# Patient Record
Sex: Male | Born: 1955 | Race: White | Hispanic: No | Marital: Married | Smoking: Never smoker
Health system: Southern US, Community
[De-identification: ages and names within clinical notes are randomized; demographics above are authoritative.]

## PROBLEM LIST (undated history)

## (undated) DIAGNOSIS — M109 Gout, unspecified: Secondary | ICD-10-CM

## (undated) DIAGNOSIS — I469 Cardiac arrest, cause unspecified: Secondary | ICD-10-CM

## (undated) DIAGNOSIS — E785 Hyperlipidemia, unspecified: Secondary | ICD-10-CM

## (undated) DIAGNOSIS — R7401 Elevation of levels of liver transaminase levels: Secondary | ICD-10-CM

## (undated) DIAGNOSIS — I4891 Unspecified atrial fibrillation: Secondary | ICD-10-CM

## (undated) DIAGNOSIS — I10 Essential (primary) hypertension: Secondary | ICD-10-CM

## (undated) DIAGNOSIS — R740 Nonspecific elevation of levels of transaminase and lactic acid dehydrogenase [LDH]: Secondary | ICD-10-CM

## (undated) DIAGNOSIS — I2510 Atherosclerotic heart disease of native coronary artery without angina pectoris: Secondary | ICD-10-CM

## (undated) DIAGNOSIS — I255 Ischemic cardiomyopathy: Secondary | ICD-10-CM

## (undated) HISTORY — DX: Gout, unspecified: M10.9

---

## 2008-07-22 LAB — HM COLONOSCOPY: HM Colonoscopy: NORMAL

## 2010-07-13 DIAGNOSIS — M109 Gout, unspecified: Secondary | ICD-10-CM

## 2010-07-13 HISTORY — DX: Gout, unspecified: M10.9

## 2011-07-14 HISTORY — PX: CORONARY ANGIOPLASTY WITH STENT PLACEMENT: SHX49

## 2011-09-07 ENCOUNTER — Ambulatory Visit (INDEPENDENT_AMBULATORY_CARE_PROVIDER_SITE_OTHER): Payer: BC Managed Care – PPO | Admitting: Family Medicine

## 2011-09-07 VITALS — BP 127/82 | HR 76 | Temp 99.0°F | Resp 16 | Ht 71.0 in | Wt 194.0 lb

## 2011-09-07 DIAGNOSIS — J029 Acute pharyngitis, unspecified: Secondary | ICD-10-CM

## 2011-09-07 LAB — POCT RAPID STREP A (OFFICE): Rapid Strep A Screen: NEGATIVE

## 2011-09-07 MED ORDER — AMOXICILLIN 875 MG PO TABS: 875.0000 mg | ORAL_TABLET | Freq: Two times a day (BID) | ORAL | Status: AC

## 2011-09-07 NOTE — Progress Notes (Signed)
  Subjective:    Patient ID: Gary Andrews, male    DOB: 1955/11/14, 56 y.o.   MRN: [id number]  HPI 56 yo male with URI symptoms.  Pain with swallowing, pain in lymph nodes, achy, subjective fever.  No runny nose, cough, ear problems. No history of strep throat.  "Get sick once a year".  Only hurts on left side of throat. Going on 3 days.  Ibuprofen - helping a little.    Review of Systems Negative except as per HPI     Objective:   Physical Exam  Constitutional: He appears well-developed. No distress.  HENT:  Right Ear: Tympanic membrane, external ear and ear canal normal. Tympanic membrane is not injected, not scarred, not perforated, not erythematous, not retracted and not bulging.  Left Ear: Tympanic membrane, external ear and ear canal normal. Tympanic membrane is not injected, not scarred, not perforated, not erythematous, not retracted and not bulging.  Nose: No mucosal edema or rhinorrhea. Right sinus exhibits no maxillary sinus tenderness and no frontal sinus tenderness. Left sinus exhibits no maxillary sinus tenderness and no frontal sinus tenderness.  Mouth/Throat: Uvula is midline and mucous membranes are normal. Posterior oropharyngeal erythema present. No oropharyngeal exudate or tonsillar abscesses.  Cardiovascular: Normal rate, regular rhythm, normal heart sounds and intact distal pulses.   No murmur heard. Pulmonary/Chest: Effort normal and breath sounds normal. No respiratory distress. He has no wheezes. He has no rales.  Lymphadenopathy:       Head (right side): No submandibular and no preauricular adenopathy present.       Head (left side): No submandibular and no preauricular adenopathy present.       Right cervical: No superficial cervical and no posterior cervical adenopathy present.      Left cervical: No superficial cervical and no posterior cervical adenopathy present.       Right: No supraclavicular adenopathy present.       Left: No supraclavicular adenopathy  present.  Skin: Skin is warm and dry.   Left tonsil enlarge, with exudate.  Left submandibular lymphadenopathy.    Results for orders placed in visit on 09/07/11  POCT RAPID STREP A (OFFICE)      Component Value Range   Rapid Strep A Screen Negative  Negative         Assessment & Plan:  Sore throat - tonsillitis, nonstrep amox Ibuprofen fluids

## 2011-09-11 ENCOUNTER — Ambulatory Visit (INDEPENDENT_AMBULATORY_CARE_PROVIDER_SITE_OTHER): Payer: BC Managed Care – PPO | Admitting: Family Medicine

## 2011-09-11 VITALS — BP 123/75 | HR 66 | Temp 98.3°F | Resp 16 | Ht 71.25 in | Wt 189.8 lb

## 2011-09-11 DIAGNOSIS — J029 Acute pharyngitis, unspecified: Secondary | ICD-10-CM

## 2011-09-11 DIAGNOSIS — J312 Chronic pharyngitis: Secondary | ICD-10-CM

## 2011-09-11 MED ORDER — CEFDINIR 300 MG PO CAPS: 300.0000 mg | ORAL_CAPSULE | Freq: Two times a day (BID) | ORAL | Status: AC

## 2011-09-11 NOTE — Progress Notes (Signed)
  Patient Name: Gary Andrews Date of Birth: 04-Feb-1956 Medical Record Number: [id number] Gender: male Date of Encounter: 09/11/2011  History of Present Illness:  Gary Andrews is a 56 y.o. very pleasant male patient who presents with the following:  Was here on 2/25 for a sore throat- he had a negative strep and was started on amoxicillin but he has not gotten much better.  Fever is gone, but does have a mild left ear ache.   Rare cough, no GI symptoms.  He is concerned mainly because of a persistent ST.  No other URI or conjunctivitis symptoms  There is no problem list on file for this patient.  No past medical history on file. No past surgical history on file. History  Substance Use Topics  . Smoking status: Never Smoker   . Smokeless tobacco: Not on file  . Alcohol Use: Not on file   No family history on file. No Known Allergies  Medication list has been reviewed and updated.  Review of Systems: As per HPI- otherwise negative.   Physical Examination: Filed Vitals:   09/11/11 1022  BP: 123/75  Pulse: 66  Temp: 98.3 F (36.8 C)  TempSrc: Oral  Resp: 16  Height: 5' 11.25" (1.81 m)  Weight: 189 lb 12.8 oz (86.093 kg)    Body mass index is 26.29 kg/(m^2).  GEN: WDWN, NAD, Non-toxic, A & O x 3 HEENT: Atraumatic, Normocephalic. Neck supple. No masses, No LAD.  Left side of oropharynx has exudate and a small amount of swelling. No evidence of abscess.  TM wnl bilaterally Ears and Nose: No external deformity. CV: RRR, No M/G/R. No JVD. No thrill. No extra heart sounds. PULM: CTA B, no wheezes, crackles, rhonchi. No retractions. No resp. distress. No accessory muscle use. ABD: S, NT, ND, +BS. No rebound. No HSM. EXTR: No c/c/e NEURO Normal gait.  PSYCH: Normally interactive. Conversant. Not depressed or anxious appearing.  Calm demeanor.    Assessment and Plan: 1. Pharyngitis  cefdinir (OMNICEF) 300 MG capsule, Culture, Group A Strep   Possible resistant strep infection  will change to omnicef and await tcx.  However, counseled patient that he may well have a virus and that is why he is not better yet. Patient (or parent if minor) instructed to return to clinic or call if not better in 3 day(s). Sooner if worse.

## 2011-09-13 LAB — CULTURE, GROUP A STREP: Organism ID, Bacteria: NORMAL

## 2012-05-14 ENCOUNTER — Encounter (HOSPITAL_COMMUNITY)
Admission: EM | Disposition: A | Discharge status: Home / Self Care | Payer: Self-pay | Source: Home / Self Care | Attending: Cardiovascular Disease

## 2012-05-14 ENCOUNTER — Ambulatory Visit (HOSPITAL_COMMUNITY): Admit: 2012-05-14 | Payer: Self-pay | Admitting: Cardiovascular Disease

## 2012-05-14 ENCOUNTER — Inpatient Hospital Stay (HOSPITAL_COMMUNITY)
Admission: EM | Admit: 2012-05-14 | Discharge: 2012-05-17 | DRG: 550 | Disposition: A | Discharge status: Home / Self Care | Payer: BC Managed Care – PPO | Attending: Cardiovascular Disease | Admitting: Cardiovascular Disease

## 2012-05-14 ENCOUNTER — Encounter (HOSPITAL_COMMUNITY): Payer: Self-pay | Admitting: Neurology

## 2012-05-14 ENCOUNTER — Emergency Department (HOSPITAL_COMMUNITY): Payer: BC Managed Care – PPO

## 2012-05-14 DIAGNOSIS — I2510 Atherosclerotic heart disease of native coronary artery without angina pectoris: Secondary | ICD-10-CM | POA: Diagnosis present

## 2012-05-14 DIAGNOSIS — I249 Acute ischemic heart disease, unspecified: Secondary | ICD-10-CM

## 2012-05-14 DIAGNOSIS — T07XXXA Unspecified multiple injuries, initial encounter: Secondary | ICD-10-CM

## 2012-05-14 DIAGNOSIS — I2109 ST elevation (STEMI) myocardial infarction involving other coronary artery of anterior wall: Principal | ICD-10-CM | POA: Diagnosis present

## 2012-05-14 DIAGNOSIS — I219 Acute myocardial infarction, unspecified: Secondary | ICD-10-CM

## 2012-05-14 DIAGNOSIS — Y998 Other external cause status: Secondary | ICD-10-CM

## 2012-05-14 DIAGNOSIS — R55 Syncope and collapse: Secondary | ICD-10-CM | POA: Diagnosis present

## 2012-05-14 DIAGNOSIS — I213 ST elevation (STEMI) myocardial infarction of unspecified site: Secondary | ICD-10-CM

## 2012-05-14 DIAGNOSIS — I469 Cardiac arrest, cause unspecified: Secondary | ICD-10-CM | POA: Diagnosis present

## 2012-05-14 DIAGNOSIS — Z9861 Coronary angioplasty status: Secondary | ICD-10-CM

## 2012-05-14 DIAGNOSIS — Z955 Presence of coronary angioplasty implant and graft: Secondary | ICD-10-CM

## 2012-05-14 DIAGNOSIS — I4891 Unspecified atrial fibrillation: Secondary | ICD-10-CM | POA: Diagnosis present

## 2012-05-14 DIAGNOSIS — I2589 Other forms of chronic ischemic heart disease: Secondary | ICD-10-CM | POA: Diagnosis present

## 2012-05-14 DIAGNOSIS — Z79899 Other long term (current) drug therapy: Secondary | ICD-10-CM

## 2012-05-14 DIAGNOSIS — R7401 Elevation of levels of liver transaminase levels: Secondary | ICD-10-CM

## 2012-05-14 DIAGNOSIS — R112 Nausea with vomiting, unspecified: Secondary | ICD-10-CM | POA: Diagnosis present

## 2012-05-14 DIAGNOSIS — Y9355 Activity, bike riding: Secondary | ICD-10-CM

## 2012-05-14 DIAGNOSIS — S7000XA Contusion of unspecified hip, initial encounter: Secondary | ICD-10-CM | POA: Diagnosis present

## 2012-05-14 DIAGNOSIS — I5021 Acute systolic (congestive) heart failure: Secondary | ICD-10-CM | POA: Diagnosis present

## 2012-05-14 DIAGNOSIS — Z23 Encounter for immunization: Secondary | ICD-10-CM

## 2012-05-14 DIAGNOSIS — K7200 Acute and subacute hepatic failure without coma: Secondary | ICD-10-CM | POA: Diagnosis present

## 2012-05-14 DIAGNOSIS — I2584 Coronary atherosclerosis due to calcified coronary lesion: Secondary | ICD-10-CM | POA: Diagnosis present

## 2012-05-14 HISTORY — DX: Essential (primary) hypertension: I10

## 2012-05-14 HISTORY — DX: Unspecified atrial fibrillation: I48.91

## 2012-05-14 HISTORY — DX: Elevation of levels of liver transaminase levels: R74.01

## 2012-05-14 HISTORY — DX: Hyperlipidemia, unspecified: E78.5

## 2012-05-14 HISTORY — PX: LEFT HEART CATHETERIZATION WITH CORONARY ANGIOGRAM: SHX5451

## 2012-05-14 HISTORY — DX: Cardiac arrest, cause unspecified: I46.9

## 2012-05-14 HISTORY — DX: Atherosclerotic heart disease of native coronary artery without angina pectoris: I25.10

## 2012-05-14 HISTORY — DX: Nonspecific elevation of levels of transaminase and lactic acid dehydrogenase (ldh): R74.0

## 2012-05-14 HISTORY — DX: Ischemic cardiomyopathy: I25.5

## 2012-05-14 LAB — CBC WITH DIFFERENTIAL/PLATELET
Basophils Absolute: 0 10*3/uL (ref 0.0–0.1)
Basophils Relative: 0 % (ref 0–1)
Eosinophils Absolute: 0 10*3/uL (ref 0.0–0.7)
Eosinophils Relative: 0 % (ref 0–5)
HCT: 43.2 % (ref 39.0–52.0)
Hemoglobin: 14.8 g/dL (ref 13.0–17.0)
Lymphocytes Relative: 16 % (ref 12–46)
Lymphs Abs: 2.2 10*3/uL (ref 0.7–4.0)
MCH: 31.5 pg (ref 26.0–34.0)
MCHC: 34.3 g/dL (ref 30.0–36.0)
MCV: 91.9 fL (ref 78.0–100.0)
Monocytes Absolute: 0.9 10*3/uL (ref 0.1–1.0)
Monocytes Relative: 6 % (ref 3–12)
Neutro Abs: 10.9 10*3/uL — ABNORMAL HIGH (ref 1.7–7.7)
Neutrophils Relative %: 77 % (ref 43–77)
Platelets: 200 10*3/uL (ref 150–400)
RBC: 4.7 MIL/uL (ref 4.22–5.81)
RDW: 13.1 % (ref 11.5–15.5)
WBC: 14.1 10*3/uL — ABNORMAL HIGH (ref 4.0–10.5)

## 2012-05-14 LAB — COMPREHENSIVE METABOLIC PANEL
ALT: 1034 U/L — ABNORMAL HIGH (ref 0–53)
ALT: 919 U/L — ABNORMAL HIGH (ref 0–53)
AST: 829 U/L — ABNORMAL HIGH (ref 0–37)
AST: 774 U/L — ABNORMAL HIGH (ref 0–37)
Albumin: 4 g/dL (ref 3.5–5.2)
Albumin: 3.8 g/dL (ref 3.5–5.2)
Alkaline Phosphatase: 75 U/L (ref 39–117)
Alkaline Phosphatase: 70 U/L (ref 39–117)
BUN: 17 mg/dL (ref 6–23)
BUN: 17 mg/dL (ref 6–23)
CO2: 20 mEq/L (ref 19–32)
CO2: 20 mEq/L (ref 19–32)
Calcium: 9.3 mg/dL (ref 8.4–10.5)
Calcium: 9 mg/dL (ref 8.4–10.5)
Chloride: 103 mEq/L (ref 96–112)
Chloride: 108 mEq/L (ref 96–112)
Creatinine, Ser: 0.99 mg/dL (ref 0.50–1.35)
Creatinine, Ser: 0.9 mg/dL (ref 0.50–1.35)
GFR calc Af Amer: >90 mL/min (ref >90)
GFR calc Af Amer: >90 mL/min (ref >90)
GFR calc non Af Amer: >90 mL/min (ref >90)
GFR calc non Af Amer: >90 mL/min (ref >90)
Glucose, Bld: 164 mg/dL — ABNORMAL HIGH (ref 70–99)
Glucose, Bld: 105 mg/dL — ABNORMAL HIGH (ref 70–99)
Potassium: 3.6 mEq/L (ref 3.5–5.1)
Potassium: 4.1 mEq/L (ref 3.5–5.1)
Sodium: 142 mEq/L (ref 135–145)
Sodium: 143 mEq/L (ref 135–145)
Total Bilirubin: 0.6 mg/dL (ref 0.3–1.2)
Total Bilirubin: 0.5 mg/dL (ref 0.3–1.2)
Total Protein: 6.7 g/dL (ref 6.0–8.3)
Total Protein: 6.2 g/dL (ref 6.0–8.3)

## 2012-05-14 LAB — POCT I-STAT TROPONIN I: Troponin i, poc: 0.04 ng/mL (ref 0.00–0.08)

## 2012-05-14 LAB — CBC
HCT: 43.4 % (ref 39.0–52.0)
Hemoglobin: 15.1 g/dL (ref 13.0–17.0)
MCH: 32.1 pg (ref 26.0–34.0)
MCHC: 34.8 g/dL (ref 30.0–36.0)
MCV: 92.3 fL (ref 78.0–100.0)
Platelets: 146 10*3/uL — ABNORMAL LOW (ref 150–400)
RBC: 4.7 MIL/uL (ref 4.22–5.81)
RDW: 13.2 % (ref 11.5–15.5)
WBC: 10.6 10*3/uL — ABNORMAL HIGH (ref 4.0–10.5)

## 2012-05-14 LAB — MRSA PCR SCREENING: MRSA by PCR: NEGATIVE

## 2012-05-14 LAB — TROPONIN I
Troponin I: 0.44 ng/mL (ref <0.30)
Troponin I: 2.6 ng/mL (ref <0.30)

## 2012-05-14 LAB — APTT: aPTT: 77 seconds — ABNORMAL HIGH (ref 24–37)

## 2012-05-14 LAB — MAGNESIUM: Magnesium: 2 mg/dL (ref 1.5–2.5)

## 2012-05-14 LAB — PROTIME-INR
INR: 1.16 (ref 0.00–1.49)
Prothrombin Time: 14.6 seconds (ref 11.6–15.2)

## 2012-05-14 LAB — LIPASE, BLOOD: Lipase: 33 U/L (ref 11–59)

## 2012-05-14 SURGERY — LEFT HEART CATHETERIZATION WITH CORONARY ANGIOGRAM
Anesthesia: LOCAL

## 2012-05-14 MED ORDER — PRASUGREL HCL 10 MG PO TABS
10.0000 mg | ORAL_TABLET | Freq: Every day | ORAL | Status: DC
  Administered 2012-05-15 – 2012-05-17 (×3): 10 mg via ORAL
  Filled 2012-05-14 (×3): qty 1

## 2012-05-14 MED ORDER — SODIUM CHLORIDE 0.9 % IV SOLN: 250.0000 mL | INTRAVENOUS | Status: DC | PRN

## 2012-05-14 MED ORDER — ONDANSETRON HCL 4 MG/2ML IJ SOLN
4.0000 mg | Freq: Once | INTRAMUSCULAR | Status: AC
  Administered 2012-05-14: 4 mg via INTRAVENOUS

## 2012-05-14 MED ORDER — HEPARIN (PORCINE) IN NACL 2-0.9 UNIT/ML-% IJ SOLN
INTRAMUSCULAR | Status: AC
  Filled 2012-05-14: qty 1000

## 2012-05-14 MED ORDER — ASPIRIN 81 MG PO CHEW
81.0000 mg | CHEWABLE_TABLET | Freq: Every day | ORAL | Status: DC
Start: 2012-05-14 — End: 2012-05-16
  Administered 2012-05-15: 81 mg via ORAL

## 2012-05-14 MED ORDER — AMIODARONE HCL IN DEXTROSE 360-4.14 MG/200ML-% IV SOLN
0.5000 mg/min | INTRAVENOUS | Status: DC
  Administered 2012-05-14: 0.5 mg/min via INTRAVENOUS
  Filled 2012-05-14 (×3): qty 200

## 2012-05-14 MED ORDER — MIDAZOLAM HCL 2 MG/2ML IJ SOLN
INTRAMUSCULAR | Status: AC
  Filled 2012-05-14: qty 2

## 2012-05-14 MED ORDER — FENTANYL CITRATE 0.05 MG/ML IJ SOLN
INTRAMUSCULAR | Status: AC
  Filled 2012-05-14: qty 2

## 2012-05-14 MED ORDER — ACETAMINOPHEN 325 MG PO TABS: 650.0000 mg | ORAL_TABLET | ORAL | Status: DC | PRN

## 2012-05-14 MED ORDER — AMIODARONE HCL 150 MG/3ML IV SOLN
INTRAVENOUS | Status: AC
  Filled 2012-05-14: qty 3

## 2012-05-14 MED ORDER — ONDANSETRON HCL 4 MG/2ML IJ SOLN
INTRAMUSCULAR | Status: AC
  Filled 2012-05-14: qty 2

## 2012-05-14 MED ORDER — AMIODARONE HCL IN DEXTROSE 360-4.14 MG/200ML-% IV SOLN
0.5000 mg/min | INTRAVENOUS | Status: DC
  Filled 2012-05-14 (×2): qty 200

## 2012-05-14 MED ORDER — AMIODARONE HCL IN DEXTROSE 360-4.14 MG/200ML-% IV SOLN
1.0000 mg/min | INTRAVENOUS | Status: AC
  Administered 2012-05-14: 1 mg/min via INTRAVENOUS
  Filled 2012-05-14 (×2): qty 200

## 2012-05-14 MED ORDER — LIDOCAINE HCL (PF) 1 % IJ SOLN
INTRAMUSCULAR | Status: AC
Start: 2012-05-14 — End: 2012-05-14
  Filled 2012-05-14: qty 30

## 2012-05-14 MED ORDER — MORPHINE SULFATE 2 MG/ML IJ SOLN: 2.0000 mg | INTRAMUSCULAR | Status: DC | PRN

## 2012-05-14 MED ORDER — ASPIRIN 81 MG PO CHEW
CHEWABLE_TABLET | ORAL | Status: AC
  Filled 2012-05-14: qty 4

## 2012-05-14 MED ORDER — NITROGLYCERIN 0.2 MG/ML ON CALL CATH LAB
INTRAVENOUS | Status: AC
  Filled 2012-05-14: qty 1

## 2012-05-14 MED ORDER — SODIUM CHLORIDE 0.9 % IJ SOLN
3.0000 mL | Freq: Two times a day (BID) | INTRAMUSCULAR | Status: DC
  Administered 2012-05-15 – 2012-05-17 (×4): 3 mL via INTRAVENOUS

## 2012-05-14 MED ORDER — SODIUM CHLORIDE 0.9 % IV BOLUS (SEPSIS)
1000.0000 mL | Freq: Once | INTRAVENOUS | Status: AC
  Administered 2012-05-14: 1000 mL via INTRAVENOUS

## 2012-05-14 MED ORDER — EPTIFIBATIDE 75 MG/100ML IV SOLN
INTRAVENOUS | Status: AC
  Filled 2012-05-14: qty 100

## 2012-05-14 MED ORDER — PROMETHAZINE HCL 25 MG/ML IJ SOLN
12.5000 mg | Freq: Four times a day (QID) | INTRAMUSCULAR | Status: DC | PRN
  Filled 2012-05-14: qty 1

## 2012-05-14 MED ORDER — CARVEDILOL 3.125 MG PO TABS
3.1250 mg | ORAL_TABLET | Freq: Two times a day (BID) | ORAL | Status: DC
  Administered 2012-05-15 – 2012-05-17 (×5): 3.125 mg via ORAL
  Filled 2012-05-14 (×8): qty 1

## 2012-05-14 MED ORDER — ZOLPIDEM TARTRATE 5 MG PO TABS: 5.0000 mg | ORAL_TABLET | Freq: Every evening | ORAL | Status: DC | PRN

## 2012-05-14 MED ORDER — AMIODARONE HCL IN DEXTROSE 360-4.14 MG/200ML-% IV SOLN: 60.0000 mg/h | INTRAVENOUS | Status: DC

## 2012-05-14 MED ORDER — ATORVASTATIN CALCIUM 80 MG PO TABS
80.0000 mg | ORAL_TABLET | Freq: Every day | ORAL | Status: DC
  Administered 2012-05-14 – 2012-05-16 (×3): 80 mg via ORAL
  Filled 2012-05-14 (×4): qty 1

## 2012-05-14 MED ORDER — SODIUM CHLORIDE 0.9 % IJ SOLN: 3.0000 mL | INTRAMUSCULAR | Status: DC | PRN

## 2012-05-14 MED ORDER — ASPIRIN EC 81 MG PO TBEC
81.0000 mg | DELAYED_RELEASE_TABLET | Freq: Every day | ORAL | Status: DC
  Administered 2012-05-15 – 2012-05-17 (×3): 81 mg via ORAL
  Filled 2012-05-14 (×3): qty 1

## 2012-05-14 MED ORDER — ALPRAZOLAM 0.25 MG PO TABS
0.2500 mg | ORAL_TABLET | Freq: Two times a day (BID) | ORAL | Status: DC | PRN
  Administered 2012-05-14: 0.25 mg via ORAL
  Filled 2012-05-14: qty 1

## 2012-05-14 MED ORDER — ONDANSETRON HCL 4 MG/2ML IJ SOLN
4.0000 mg | Freq: Four times a day (QID) | INTRAMUSCULAR | Status: DC | PRN
  Administered 2012-05-14: 4 mg via INTRAVENOUS
  Filled 2012-05-14: qty 2

## 2012-05-14 MED ORDER — SODIUM CHLORIDE 0.9 % IV SOLN: 250.0000 mL | INTRAVENOUS | Status: DC

## 2012-05-14 MED ORDER — NITROGLYCERIN 0.4 MG SL SUBL: 0.4000 mg | SUBLINGUAL_TABLET | SUBLINGUAL | Status: DC | PRN

## 2012-05-14 MED ORDER — SODIUM CHLORIDE 0.9 % IJ SOLN
3.0000 mL | Freq: Two times a day (BID) | INTRAMUSCULAR | Status: DC
  Administered 2012-05-14: 3 mL via INTRAVENOUS

## 2012-05-14 MED ORDER — AMIODARONE LOAD VIA INFUSION
150.0000 mg | Freq: Once | INTRAVENOUS | Status: DC
  Filled 2012-05-14: qty 83.34

## 2012-05-14 MED ORDER — OXYCODONE-ACETAMINOPHEN 5-325 MG PO TABS
1.0000 | ORAL_TABLET | ORAL | Status: DC | PRN
  Administered 2012-05-14 – 2012-05-16 (×4): 2 via ORAL
  Filled 2012-05-14 (×4): qty 2

## 2012-05-14 MED ORDER — ASPIRIN 81 MG PO CHEW
324.0000 mg | CHEWABLE_TABLET | Freq: Once | ORAL | Status: AC
  Administered 2012-05-14: 324 mg via ORAL

## 2012-05-14 MED ORDER — AMIODARONE HCL IN DEXTROSE 360-4.14 MG/200ML-% IV SOLN
1.0000 mg/min | INTRAVENOUS | Status: DC
  Filled 2012-05-14 (×2): qty 200

## 2012-05-14 MED ORDER — SODIUM CHLORIDE 0.9 % IV SOLN: 1.0000 mL/kg/h | INTRAVENOUS | Status: AC

## 2012-05-14 MED ORDER — PROMETHAZINE HCL 25 MG/ML IJ SOLN: 25.0000 mg | Freq: Four times a day (QID) | INTRAMUSCULAR | Status: DC | PRN

## 2012-05-14 MED ORDER — PRASUGREL HCL 10 MG PO TABS
ORAL_TABLET | ORAL | Status: AC
  Administered 2012-05-15: 10 mg via ORAL
  Filled 2012-05-14: qty 6

## 2012-05-14 MED ORDER — HEPARIN SODIUM (PORCINE) 5000 UNIT/ML IJ SOLN
INTRAMUSCULAR | Status: AC
  Administered 2012-05-14: 4000 [IU]
  Filled 2012-05-14: qty 1

## 2012-05-14 MED ORDER — ONDANSETRON HCL 4 MG/2ML IJ SOLN: 4.0000 mg | Freq: Four times a day (QID) | INTRAMUSCULAR | Status: DC | PRN
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
## 2012-05-14 NOTE — ED Notes (Addendum)
Pt showing increasing ST elevation. Zoll pads in place, crash cart at bedside. Waiting for cath lab

## 2012-05-14 NOTE — Progress Notes (Signed)
Bleeding noted from right groin at 1625. Level 2 groin. Manual pressure immediately held just above perclose site. Christain Sacramento, NP made aware and responded to bedside at 1650. Manual pressure released at 1650 and occlusive pressure dsg applied per order until femstop available. Minimal superficial oozing noted when femstop applied at 1700. Will continue to monitor right groin closely.
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
## 2012-05-14 NOTE — ED Notes (Signed)
All belongings given to patient girlfriend

## 2012-05-14 NOTE — ED Notes (Signed)
Pt taken to cath lab

## 2012-05-14 NOTE — ED Notes (Signed)
Pt had episode of nausea 

## 2012-05-14 NOTE — Progress Notes (Signed)
05/14/12 1300  Clinical Encounter Type  Visited With Family  Visit Type ED  I was paged for a patient code stemi. I spoke briefly to a family member. She did not need any support at this time.  Veryl Speak
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
## 2012-05-14 NOTE — ED Notes (Signed)
Pt c/o cp, "can't breathe". EKG obtained. Showing ST elevation.
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
## 2012-05-14 NOTE — Progress Notes (Signed)
  Amiodarone Drug - Drug Interaction Consult Note  Recommendations: Not currently prescribed any interacting medications  Amiodarone is metabolized by the cytochrome P450 system and therefore has the potential to cause many drug interactions. Amiodarone has an average plasma half-life of 50 days (range 20 to 100 days).   There is potential for drug interactions to occur several weeks or months after stopping treatment and the onset of drug interactions may be slow after initiating amiodarone.   []  Statins: Increased risk of myopathy. Simvastatin- restrict dose to 20mg  daily. Other statins: counsel patients to report any muscle pain or weakness immediately.  []  Anticoagulants: Amiodarone can increase anticoagulant effect. Consider warfarin dose reduction. Patients should be monitored closely and the dose of anticoagulant altered accordingly, remembering that amiodarone levels take several weeks to stabilize.  []  Antiepileptics: Amiodarone can increase plasma concentration of phenytoin, phenytoin dose should be reduced. Note that small changes in phenytoin dose can result in large changes in phenytoin levels. Monitor patient closely and counsel on signs of toxicity.  []  Beta blockers: increased risk of bradycardia, AV block and myocardial depression. Sotalol - avoid concomitant use.  []   Calcium channel blockers (diltiazem and verapamil): increased risk of bradycardia, AV block and myocardial depression.  []   Cyclosporine: Amiodarone increases levels of cyclosporine. Reduced dose of cyclosporine is recommended.  []  Digoxin dose should be halved when amiodarone is started.  []  Diuretics: increased risk of cardiotoxicity if hypokalemia occurs.  []  Oral hypoglycemic agents (glyburide, glipizide, glimepiride): increased risk of hypoglycemia. Patient's glucose levels should be monitored closely when initiating amiodarone therapy.   []  Drugs that prolong the QT interval: Concurrent therapy is  contraindicated due to the increased risk of torsades de pointes; . Antibiotics: e.g. fluoroquinolones, erythromycin. . Antiarrhythmics: e.g. quinidine, procainamide, disopyramide, sotalol. . Antipsychotics: e.g. phenothiazines, haloperidol.  . Lithium, tricyclic antidepressants, and methadone. Thank You,  Marcelino Scot  05/14/2012 4:41 PM

## 2012-05-14 NOTE — ED Notes (Signed)
Waiting for cath lab to call, ST elevation on monitor. NRB in place.

## 2012-05-15 DIAGNOSIS — I200 Unstable angina: Secondary | ICD-10-CM

## 2012-05-15 DIAGNOSIS — I213 ST elevation (STEMI) myocardial infarction of unspecified site: Secondary | ICD-10-CM

## 2012-05-15 DIAGNOSIS — I369 Nonrheumatic tricuspid valve disorder, unspecified: Secondary | ICD-10-CM

## 2012-05-15 DIAGNOSIS — I249 Acute ischemic heart disease, unspecified: Secondary | ICD-10-CM

## 2012-05-15 DIAGNOSIS — R7401 Elevation of levels of liver transaminase levels: Secondary | ICD-10-CM

## 2012-05-15 LAB — CBC
HCT: 37.9 % — ABNORMAL LOW (ref 39.0–52.0)
Hemoglobin: 12.9 g/dL — ABNORMAL LOW (ref 13.0–17.0)
MCH: 31.4 pg (ref 26.0–34.0)
MCHC: 34 g/dL (ref 30.0–36.0)
MCV: 92.2 fL (ref 78.0–100.0)
Platelets: 141 10*3/uL — ABNORMAL LOW (ref 150–400)
RBC: 4.11 MIL/uL — ABNORMAL LOW (ref 4.22–5.81)
RDW: 13.3 % (ref 11.5–15.5)
WBC: 5.7 10*3/uL (ref 4.0–10.5)

## 2012-05-15 LAB — BASIC METABOLIC PANEL
BUN: 12 mg/dL (ref 6–23)
CO2: 29 mEq/L (ref 19–32)
Calcium: 8.7 mg/dL (ref 8.4–10.5)
Chloride: 105 mEq/L (ref 96–112)
Creatinine, Ser: 0.86 mg/dL (ref 0.50–1.35)
GFR calc Af Amer: >90 mL/min (ref >90)
GFR calc non Af Amer: >90 mL/min (ref >90)
Glucose, Bld: 98 mg/dL (ref 70–99)
Potassium: 4 mEq/L (ref 3.5–5.1)
Sodium: 141 mEq/L (ref 135–145)

## 2012-05-15 LAB — TROPONIN I: Troponin I: 1.84 ng/mL (ref <0.30)

## 2012-05-15 LAB — LIPID PANEL
Cholesterol: 170 mg/dL (ref 0–200)
HDL: 47 mg/dL (ref >39)
LDL Cholesterol: 113 mg/dL — ABNORMAL HIGH (ref 0–99)
Total CHOL/HDL Ratio: 3.6 RATIO
Triglycerides: 48 mg/dL (ref <150)
VLDL: 10 mg/dL (ref 0–40)

## 2012-05-15 LAB — TSH: TSH: 1.296 u[IU]/mL (ref 0.350–4.500)

## 2012-05-15 LAB — HEMOGLOBIN A1C
Hgb A1c MFr Bld: 5.5 % (ref <5.7)
Mean Plasma Glucose: 111 mg/dL (ref <117)

## 2012-05-15 MED ORDER — INFLUENZA VIRUS VACC SPLIT PF IM SUSP
0.5000 mL | INTRAMUSCULAR | Status: AC
  Administered 2012-05-16: 0.5 mL via INTRAMUSCULAR
  Filled 2012-05-15: qty 0.5

## 2012-05-15 NOTE — Progress Notes (Signed)
  Echocardiogram 2D Echocardiogram has been performed.  Gary Andrews 05/15/2012, 12:18 PM
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
## 2012-05-15 NOTE — Progress Notes (Signed)
Patient examined and I agree with the assessment and plan  Violeta Gelinas, MD, MPH, FACS Pager: [phone]  05/15/2012 12:59 PM
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
## 2012-05-16 LAB — COMPREHENSIVE METABOLIC PANEL
ALT: 511 U/L — ABNORMAL HIGH (ref 0–53)
AST: 237 U/L — ABNORMAL HIGH (ref 0–37)
Albumin: 3.5 g/dL (ref 3.5–5.2)
Alkaline Phosphatase: 61 U/L (ref 39–117)
BUN: 13 mg/dL (ref 6–23)
CO2: 30 mEq/L (ref 19–32)
Calcium: 9 mg/dL (ref 8.4–10.5)
Chloride: 106 mEq/L (ref 96–112)
Creatinine, Ser: 0.92 mg/dL (ref 0.50–1.35)
GFR calc Af Amer: >90 mL/min (ref >90)
GFR calc non Af Amer: >90 mL/min (ref >90)
Glucose, Bld: 92 mg/dL (ref 70–99)
Potassium: 4 mEq/L (ref 3.5–5.1)
Sodium: 143 mEq/L (ref 135–145)
Total Bilirubin: 0.6 mg/dL (ref 0.3–1.2)
Total Protein: 6.1 g/dL (ref 6.0–8.3)

## 2012-05-16 LAB — CBC
HCT: 38.7 % — ABNORMAL LOW (ref 39.0–52.0)
Hemoglobin: 13 g/dL (ref 13.0–17.0)
MCH: 31.6 pg (ref 26.0–34.0)
MCHC: 33.6 g/dL (ref 30.0–36.0)
MCV: 93.9 fL (ref 78.0–100.0)
Platelets: 144 10*3/uL — ABNORMAL LOW (ref 150–400)
RBC: 4.12 MIL/uL — ABNORMAL LOW (ref 4.22–5.81)
RDW: 13.3 % (ref 11.5–15.5)
WBC: 4.6 10*3/uL (ref 4.0–10.5)

## 2012-05-16 LAB — POCT ACTIVATED CLOTTING TIME
Activated Clotting Time: 190 seconds
Activated Clotting Time: 214 seconds
Activated Clotting Time: 214 seconds

## 2012-05-16 MED ORDER — LISINOPRIL 2.5 MG PO TABS
2.5000 mg | ORAL_TABLET | Freq: Every day | ORAL | Status: DC
  Administered 2012-05-16 – 2012-05-17 (×2): 2.5 mg via ORAL
  Filled 2012-05-16 (×2): qty 1
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
## 2012-05-16 NOTE — Progress Notes (Signed)
UR Completed Purvis Sidle Graves-Bigelow, RN,BSN [phone]  
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
## 2012-05-16 NOTE — Care Management Note (Unsigned)
    Page 1 of 1   05/16/2012     11:17:54 AM   CARE MANAGEMENT NOTE 05/16/2012  Patient:  Gary Andrews, Gary Andrews   Account Number:  [id number]  Date Initiated:  05/16/2012  Documentation initiated by:  GRAVES-BIGELOW,Jaiyden Laur  Subjective/Objective Assessment:   Pt admitted with Stemi. Post cath. Plan for home tomorrow. CM did a benefits check for effient. Will make pt aware once completed.     Action/Plan:   CM will continue to f/u for additional needs.   Anticipated DC Date:  05/17/2012   Anticipated DC Plan:  HOME/SELF CARE      DC Planning Services  CM consult      Choice offered to / List presented to:             Status of service:  In process, will continue to follow Medicare Important Message given?   (If response is "NO", the following Medicare IM given date fields will be blank) Date Medicare IM given:   Date Additional Medicare IM given:    Discharge Disposition:    Per UR Regulation:  Reviewed for med. necessity/level of care/duration of stay  If discussed at Long Length of Stay Meetings, dates discussed:    Comments:

## 2012-05-17 ENCOUNTER — Encounter (HOSPITAL_COMMUNITY): Payer: Self-pay | Admitting: Physician Assistant

## 2012-05-17 ENCOUNTER — Telehealth: Payer: Self-pay | Admitting: Pharmacist

## 2012-05-17 DIAGNOSIS — I2510 Atherosclerotic heart disease of native coronary artery without angina pectoris: Secondary | ICD-10-CM

## 2012-05-17 MED ORDER — ASPIRIN 81 MG PO TABS: 81.0000 mg | ORAL_TABLET | Freq: Every day | ORAL | Status: AC

## 2012-05-17 MED ORDER — CARVEDILOL 3.125 MG PO TABS: 3.1250 mg | ORAL_TABLET | Freq: Two times a day (BID) | ORAL | Status: DC

## 2012-05-17 MED ORDER — ATORVASTATIN CALCIUM 80 MG PO TABS: 80.0000 mg | ORAL_TABLET | Freq: Every day | ORAL | Status: DC

## 2012-05-17 MED ORDER — LISINOPRIL 2.5 MG PO TABS: 2.5000 mg | ORAL_TABLET | Freq: Every day | ORAL | Status: DC

## 2012-05-17 MED ORDER — PRASUGREL HCL 10 MG PO TABS: 10.0000 mg | ORAL_TABLET | Freq: Every day | ORAL | Status: DC

## 2012-05-17 MED ORDER — NITROGLYCERIN 0.4 MG SL SUBL: 0.4000 mg | SUBLINGUAL_TABLET | SUBLINGUAL | Status: DC | PRN
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
## 2012-05-17 NOTE — Telephone Encounter (Signed)
Message copied by Velda Shell on Tue May 17, 2012 12:01 PM ------      Message from: Glynda Jaeger A      Created: Tue May 17, 2012 11:52 AM       Transition of care pt per dayna                  Thanks Melissa

## 2012-05-18 ENCOUNTER — Other Ambulatory Visit: Payer: Self-pay | Admitting: *Deleted

## 2012-05-18 DIAGNOSIS — R7401 Elevation of levels of liver transaminase levels: Secondary | ICD-10-CM

## 2012-05-18 DIAGNOSIS — I249 Acute ischemic heart disease, unspecified: Secondary | ICD-10-CM

## 2012-05-18 DIAGNOSIS — I213 ST elevation (STEMI) myocardial infarction of unspecified site: Secondary | ICD-10-CM

## 2012-05-18 NOTE — Telephone Encounter (Signed)
Left message on VM for pt to call office

## 2012-05-18 NOTE — Telephone Encounter (Signed)
**Note De-Identified Ineta Sinning Obfuscation** Pt. states that he is doing well since his d/c from hosp. and that he has all of his medications. Also, pt. Is aware of f/u appt. with Norma Fredrickson, NP on 11/12.

## 2012-05-24 ENCOUNTER — Encounter: Payer: Self-pay | Admitting: Nurse Practitioner

## 2012-05-24 ENCOUNTER — Ambulatory Visit (INDEPENDENT_AMBULATORY_CARE_PROVIDER_SITE_OTHER): Payer: BC Managed Care – PPO | Admitting: Nurse Practitioner

## 2012-05-24 ENCOUNTER — Other Ambulatory Visit (INDEPENDENT_AMBULATORY_CARE_PROVIDER_SITE_OTHER): Payer: BC Managed Care – PPO

## 2012-05-24 VITALS — BP 110/76 | HR 58 | Ht 72.0 in | Wt 183.8 lb

## 2012-05-24 DIAGNOSIS — I25810 Atherosclerosis of coronary artery bypass graft(s) without angina pectoris: Secondary | ICD-10-CM

## 2012-05-24 DIAGNOSIS — I213 ST elevation (STEMI) myocardial infarction of unspecified site: Secondary | ICD-10-CM

## 2012-05-24 DIAGNOSIS — R0989 Other specified symptoms and signs involving the circulatory and respiratory systems: Secondary | ICD-10-CM

## 2012-05-24 DIAGNOSIS — R7401 Elevation of levels of liver transaminase levels: Secondary | ICD-10-CM

## 2012-05-24 DIAGNOSIS — I249 Acute ischemic heart disease, unspecified: Secondary | ICD-10-CM

## 2012-05-24 DIAGNOSIS — E785 Hyperlipidemia, unspecified: Secondary | ICD-10-CM
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
## 2012-05-24 NOTE — Patient Instructions (Addendum)
Stay on your current medicines  Use Coricidin HBP for cold symptoms  Call us if you have fever/chills  Tylenol is ok to take  We will check labs today  See Dr. Elease Hashimoto before your trip. We will do fasting labs at that visit  Walking 10 minutes twice a day. Avoid biking at this time.   Not to return to work until seen back at your next visit  We will refer you to primary care  Call the [facility] office at [phone] if you have any questions, problems or concerns.

## 2012-05-25 LAB — HEPATIC FUNCTION PANEL
ALT: 92 U/L — ABNORMAL HIGH (ref 0–53)
AST: 42 U/L — ABNORMAL HIGH (ref 0–37)
Albumin: 4.5 g/dL (ref 3.5–5.2)
Alkaline Phosphatase: 84 U/L (ref 39–117)
Bilirubin, Direct: 0.1 mg/dL (ref 0.0–0.3)
Total Bilirubin: 0.8 mg/dL (ref 0.3–1.2)
Total Protein: 7.5 g/dL (ref 6.0–8.3)

## 2012-05-25 LAB — COMPREHENSIVE METABOLIC PANEL
ALT: 92 U/L — ABNORMAL HIGH (ref 0–53)
AST: 42 U/L — ABNORMAL HIGH (ref 0–37)
Albumin: 4.5 g/dL (ref 3.5–5.2)
Alkaline Phosphatase: 84 U/L (ref 39–117)
BUN: 15 mg/dL (ref 6–23)
CO2: 25 mEq/L (ref 19–32)
Calcium: 9.2 mg/dL (ref 8.4–10.5)
Chloride: 102 mEq/L (ref 96–112)
Creatinine, Ser: 0.9 mg/dL (ref 0.4–1.5)
GFR: 96.94 mL/min (ref >60.00)
Glucose, Bld: 82 mg/dL (ref 70–99)
Potassium: 4.4 mEq/L (ref 3.5–5.1)
Sodium: 138 mEq/L (ref 135–145)
Total Bilirubin: 0.8 mg/dL (ref 0.3–1.2)
Total Protein: 7.5 g/dL (ref 6.0–8.3)

## 2012-05-25 LAB — LIPID PANEL
Cholesterol: 142 mg/dL (ref 0–200)
HDL: 34.4 mg/dL — ABNORMAL LOW (ref >39.00)
LDL Cholesterol: 79 mg/dL (ref 0–99)
Total CHOL/HDL Ratio: 4
Triglycerides: 144 mg/dL (ref 0.0–149.0)
VLDL: 28.8 mg/dL (ref 0.0–40.0)

## 2012-06-02 ENCOUNTER — Ambulatory Visit (INDEPENDENT_AMBULATORY_CARE_PROVIDER_SITE_OTHER): Payer: BC Managed Care – PPO | Admitting: Cardiovascular Disease

## 2012-06-02 ENCOUNTER — Other Ambulatory Visit: Payer: Self-pay | Admitting: *Deleted

## 2012-06-02 ENCOUNTER — Other Ambulatory Visit (INDEPENDENT_AMBULATORY_CARE_PROVIDER_SITE_OTHER): Payer: BC Managed Care – PPO

## 2012-06-02 ENCOUNTER — Encounter: Payer: Self-pay | Admitting: Cardiovascular Disease

## 2012-06-02 VITALS — BP 100/76 | HR 62 | Ht 72.0 in | Wt 187.2 lb

## 2012-06-02 DIAGNOSIS — E785 Hyperlipidemia, unspecified: Secondary | ICD-10-CM

## 2012-06-02 DIAGNOSIS — I213 ST elevation (STEMI) myocardial infarction of unspecified site: Secondary | ICD-10-CM

## 2012-06-02 DIAGNOSIS — I249 Acute ischemic heart disease, unspecified: Secondary | ICD-10-CM

## 2012-06-02 DIAGNOSIS — I2510 Atherosclerotic heart disease of native coronary artery without angina pectoris: Secondary | ICD-10-CM

## 2012-06-02 DIAGNOSIS — I219 Acute myocardial infarction, unspecified: Secondary | ICD-10-CM

## 2012-06-02 DIAGNOSIS — I200 Unstable angina: Secondary | ICD-10-CM

## 2012-06-02 LAB — LIPID PANEL
Cholesterol: 136 mg/dL (ref 0–200)
HDL: 37.4 mg/dL — ABNORMAL LOW (ref >39.00)
LDL Cholesterol: 76 mg/dL (ref 0–99)
Total CHOL/HDL Ratio: 4
Triglycerides: 114 mg/dL (ref 0.0–149.0)
VLDL: 22.8 mg/dL (ref 0.0–40.0)

## 2012-06-02 LAB — BASIC METABOLIC PANEL
BUN: 18 mg/dL (ref 6–23)
CO2: 31 mEq/L (ref 19–32)
Calcium: 9.4 mg/dL (ref 8.4–10.5)
Chloride: 105 mEq/L (ref 96–112)
Creatinine, Ser: 1 mg/dL (ref 0.4–1.5)
GFR: 85.5 mL/min (ref >60.00)
Glucose, Bld: 101 mg/dL — ABNORMAL HIGH (ref 70–99)
Potassium: 3.9 mEq/L (ref 3.5–5.1)
Sodium: 141 mEq/L (ref 135–145)

## 2012-06-02 LAB — HEPATIC FUNCTION PANEL
ALT: 55 U/L — ABNORMAL HIGH (ref 0–53)
AST: 42 U/L — ABNORMAL HIGH (ref 0–37)
Albumin: 4.2 g/dL (ref 3.5–5.2)
Alkaline Phosphatase: 89 U/L (ref 39–117)
Bilirubin, Direct: 0 mg/dL (ref 0.0–0.3)
Total Bilirubin: 0.9 mg/dL (ref 0.3–1.2)
Total Protein: 7.2 g/dL (ref 6.0–8.3)
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
## 2012-06-02 NOTE — Patient Instructions (Addendum)
Follow up in one week after the Echo in the [place] office. Need to have an Echo in 6 weeks. Continue with the current medications.

## 2012-06-02 NOTE — Progress Notes (Signed)
Gary Andrews Date of Birth  Feb 24, 1956       [facility]    [place] [address]  [address] [place], [place]  [address]   [place], [place]  [address] [phone]     [phone]   Fax  [phone]    Fax [phone]  Problem List: 1. Coronary artery disease: -Status post myocardial infarction while slicing. He had a ventricular fibrillation cardiac arrest received CPR. He had a stent in his LAD .  Hx of LCx 2. Hyperlipidemia  History of Present Illness:  Gary Andrews is a 56 yo with hx of CAD  He has an old stent ( ? 2 stents) to the LCx.   He had been having angina while riding for two weeks prior to his recent MI.  He presented on November 3 with a cardiac arrest while cycling up a hill.  He had stopped taking his statin.   He was found to have an occluded LAD. He had PTCA and stenting of his left anterior descending artery. The stent to the left circumflex artery was still patent.  He was seen by Gary Andrews last week. He's overall feeling quite well. He's not had any episodes of chest pain or shortness of breath.  Current Outpatient Prescriptions on File Prior to Visit  Medication Sig Dispense Refill  . aspirin 81 MG tablet Take 1 tablet (81 mg total) by mouth daily.      Marland Kitchen atorvastatin (LIPITOR) 80 MG tablet Take 1 tablet (80 mg total) by mouth at bedtime.  30 tablet  6  . carvedilol (COREG) 3.125 MG tablet Take 1 tablet (3.125 mg total) by mouth 2 (two) times daily with a meal.  60 tablet  6  . lisinopril (PRINIVIL,ZESTRIL) 2.5 MG tablet Take 1 tablet (2.5 mg total) by mouth daily.  30 tablet  6  . minocycline (MINOCIN,DYNACIN) 100 MG capsule Take 100 mg by mouth daily.      . Multiple Vitamin (MULTIVITAMIN) capsule Take 1 capsule by mouth daily.      . nitroGLYCERIN (NITROSTAT) 0.4 MG SL tablet Place 1 tablet (0.4 mg total) under the tongue every 5 (five) minutes as needed for chest pain (up to 3 doses).  25 tablet  4  . prasugrel (EFFIENT)  10 MG TABS Take 1 tablet (10 mg total) by mouth daily.  30 tablet  6    No Known Allergies  Past Medical History  Diagnosis Date  . HTN (hypertension)   . Hyperlipidemia   . Atrial fibrillation     a. Noted at time of STEMI 05/2012, converted on amiodarone.  . Cardiac arrest     a. 05/2012 - came in as bystander CPR, pulses back before EMS arrived at time of STEMI.  . Ischemic cardiomyopathy     a. EF 35% by cath at time of STEMI, improved to 50-55% at time of DC 05/2012.  . Transaminitis     a. At time of STEMI 05/2012 ?shock liver.  Marland Kitchen CAD (coronary artery disease)     a. 2012 s/p stenting x 2 (3 mos apart) [facility] [place]. b. Acute ant-lat STEMI s/p DES to prox LAD 05/14/12, residual mod Cx dz for med rx - possible cardiac arrest (CPR).    Past Surgical History  Procedure Date  . Coronary angioplasty with stent placement 2013    History  Smoking status  . Never Smoker   Smokeless tobacco  . Not on file  History  Alcohol Use  . Yes    Comment: occasional drink.    Family History  Problem Relation Age of Onset  . CAD Father     alive  . Heart disease Father   . Heart attack Father   . CAD Brother     alive  . Heart disease Brother   . Heart disease Brother     Reviw of Systems:  Reviewed in the HPI.  All other systems are negative.  Physical Exam: Blood pressure 100/76, pulse 62, height 6' (1.829 m), weight 187 lb 4 oz (84.936 kg). General: Well developed, well nourished, in no acute distress.  Head: Normocephalic, atraumatic, sclera non-icteric, mucus membranes are moist,   Neck: Supple. Carotids are 2 + without bruits. No JVD  Lungs: Clear bilaterally to auscultation.  Heart: regular rate.  normal  S1 S2. No murmurs, gallops or rubs.  Abdomen: Soft, non-tender, non-distended with normal bowel sounds. No hepatomegaly. No rebound/guarding. No masses.  Msk:  Strength and tone are normal  Extremities: No clubbing or cyanosis. No edema.  Distal pedal  pulses are 2+ and equal bilaterally.  Neuro: Alert and oriented X 3. Moves all extremities spontaneously.  Psych:  Responds to questions appropriately with a normal affect.  ECG: 06/02/2012-normal sinus rhythm. He has an incomplete right bundle branch block.  Assessment / Plan:

## 2012-07-19 ENCOUNTER — Ambulatory Visit (INDEPENDENT_AMBULATORY_CARE_PROVIDER_SITE_OTHER): Payer: BC Managed Care – PPO | Admitting: Cardiology

## 2012-07-19 ENCOUNTER — Ambulatory Visit (INDEPENDENT_AMBULATORY_CARE_PROVIDER_SITE_OTHER): Payer: BC Managed Care – PPO

## 2012-07-19 ENCOUNTER — Telehealth: Payer: Self-pay

## 2012-07-19 DIAGNOSIS — I2510 Atherosclerotic heart disease of native coronary artery without angina pectoris: Secondary | ICD-10-CM

## 2012-07-19 DIAGNOSIS — I213 ST elevation (STEMI) myocardial infarction of unspecified site: Secondary | ICD-10-CM

## 2012-07-19 DIAGNOSIS — M2550 Pain in unspecified joint: Secondary | ICD-10-CM

## 2012-07-19 NOTE — Telephone Encounter (Signed)
Uric acid level is still pending.  He can have colchicine 0.6 mg BID to see if that helps. Also he can try Motrin 400 TID for 3-4 days to see if that helps

## 2012-07-19 NOTE — Telephone Encounter (Signed)
FYI Pt is new to [place] No PCP Establishing with PCP 1/10 for the first time Here for echo Seeing you 1/10 in [place] Says he thinks he has "gout" and needs meds for this We advised him to f/u with PCP but he explained he does not have one yet (new appt 1/10) We went ahead and drew uric acid levels while in office today and can forward to PCP if you feel necessary Pt wanted [place] to go ahead and prescribe a pain med for his "gout" We explained this cannot be done until levels come back, if at all He will await results

## 2012-07-20 ENCOUNTER — Other Ambulatory Visit: Payer: Self-pay

## 2012-07-20 LAB — URIC ACID: Uric Acid: 6 mg/dL (ref 3.7–8.6)

## 2012-07-20 MED ORDER — COLCHICINE 0.6 MG PO TABS: 0.6000 mg | ORAL_TABLET | Freq: Two times a day (BID) | ORAL | Status: DC

## 2012-07-20 NOTE — Telephone Encounter (Signed)
lmtcb

## 2012-07-20 NOTE — Telephone Encounter (Signed)
Pt informed Understanding verb New RX sent to pharmacy 

## 2012-07-21 ENCOUNTER — Ambulatory Visit: Payer: BC Managed Care – PPO | Admitting: Internal Medicine

## 2012-07-22 ENCOUNTER — Other Ambulatory Visit: Payer: BC Managed Care – PPO

## 2012-07-22 ENCOUNTER — Encounter: Payer: Self-pay | Admitting: Cardiovascular Disease

## 2012-07-22 ENCOUNTER — Other Ambulatory Visit (INDEPENDENT_AMBULATORY_CARE_PROVIDER_SITE_OTHER): Payer: BC Managed Care – PPO

## 2012-07-22 ENCOUNTER — Ambulatory Visit (INDEPENDENT_AMBULATORY_CARE_PROVIDER_SITE_OTHER): Payer: BC Managed Care – PPO | Admitting: Internal Medicine

## 2012-07-22 ENCOUNTER — Ambulatory Visit (INDEPENDENT_AMBULATORY_CARE_PROVIDER_SITE_OTHER): Payer: BC Managed Care – PPO | Admitting: Cardiovascular Disease

## 2012-07-22 ENCOUNTER — Encounter: Payer: Self-pay | Admitting: Internal Medicine

## 2012-07-22 VITALS — BP 98/68 | HR 52 | Ht 72.0 in | Wt 189.0 lb

## 2012-07-22 VITALS — BP 118/88 | HR 60 | Temp 97.0°F | Resp 16 | Ht 72.0 in | Wt 189.0 lb

## 2012-07-22 DIAGNOSIS — M109 Gout, unspecified: Secondary | ICD-10-CM

## 2012-07-22 DIAGNOSIS — I2510 Atherosclerotic heart disease of native coronary artery without angina pectoris: Secondary | ICD-10-CM

## 2012-07-22 DIAGNOSIS — D649 Anemia, unspecified: Secondary | ICD-10-CM

## 2012-07-22 DIAGNOSIS — R7401 Elevation of levels of liver transaminase levels: Secondary | ICD-10-CM

## 2012-07-22 DIAGNOSIS — Z23 Encounter for immunization: Secondary | ICD-10-CM

## 2012-07-22 DIAGNOSIS — Z0000 Encounter for general adult medical examination without abnormal findings: Secondary | ICD-10-CM

## 2012-07-22 DIAGNOSIS — Z9861 Coronary angioplasty status: Secondary | ICD-10-CM

## 2012-07-22 DIAGNOSIS — R7309 Other abnormal glucose: Secondary | ICD-10-CM

## 2012-07-22 LAB — CBC WITH DIFFERENTIAL/PLATELET
Basophils Absolute: 0 10*3/uL (ref 0.0–0.1)
Basophils Relative: 0.4 % (ref 0.0–3.0)
Eosinophils Absolute: 0.1 10*3/uL (ref 0.0–0.7)
Eosinophils Relative: 1.5 % (ref 0.0–5.0)
HCT: 44.2 % (ref 39.0–52.0)
Hemoglobin: 14.9 g/dL (ref 13.0–17.0)
Lymphocytes Relative: 39 % (ref 12.0–46.0)
Lymphs Abs: 1.6 10*3/uL (ref 0.7–4.0)
MCHC: 33.7 g/dL (ref 30.0–36.0)
MCV: 92.8 fl (ref 78.0–100.0)
Monocytes Absolute: 0.5 10*3/uL (ref 0.1–1.0)
Monocytes Relative: 11.7 % (ref 3.0–12.0)
Neutro Abs: 1.9 10*3/uL (ref 1.4–7.7)
Neutrophils Relative %: 47.4 % (ref 43.0–77.0)
Platelets: 185 10*3/uL (ref 150.0–400.0)
RBC: 4.76 Mil/uL (ref 4.22–5.81)
RDW: 13.9 % (ref 11.5–14.6)
WBC: 4 10*3/uL — ABNORMAL LOW (ref 4.5–10.5)

## 2012-07-22 LAB — URINALYSIS, ROUTINE W REFLEX MICROSCOPIC
Bilirubin Urine: NEGATIVE
Hgb urine dipstick: NEGATIVE
Ketones, ur: NEGATIVE
Leukocytes, UA: NEGATIVE
Nitrite: NEGATIVE
Specific Gravity, Urine: 1.015 (ref 1.000–1.030)
Total Protein, Urine: NEGATIVE
Urine Glucose: NEGATIVE
Urobilinogen, UA: 0.2 (ref 0.0–1.0)
pH: 6 (ref 5.0–8.0)

## 2012-07-22 LAB — PSA: PSA: 0.9 ng/mL (ref 0.10–4.00)

## 2012-07-22 LAB — IBC PANEL
Iron: 92 ug/dL (ref 42–165)
Saturation Ratios: 26 % (ref 20.0–50.0)
Transferrin: 252.9 mg/dL (ref 212.0–360.0)

## 2012-07-22 LAB — COMPREHENSIVE METABOLIC PANEL
ALT: 36 U/L (ref 0–53)
AST: 35 U/L (ref 0–37)
Albumin: 4.5 g/dL (ref 3.5–5.2)
Alkaline Phosphatase: 74 U/L (ref 39–117)
BUN: 20 mg/dL (ref 6–23)
CO2: 32 mEq/L (ref 19–32)
Calcium: 9.7 mg/dL (ref 8.4–10.5)
Chloride: 105 mEq/L (ref 96–112)
Creatinine, Ser: 0.9 mg/dL (ref 0.4–1.5)
GFR: 88.61 mL/min (ref >60.00)
Glucose, Bld: 72 mg/dL (ref 70–99)
Potassium: 5 mEq/L (ref 3.5–5.1)
Sodium: 141 mEq/L (ref 135–145)
Total Bilirubin: 0.9 mg/dL (ref 0.3–1.2)
Total Protein: 7.2 g/dL (ref 6.0–8.3)

## 2012-07-22 LAB — TSH: TSH: 1.73 u[IU]/mL (ref 0.35–5.50)

## 2012-07-22 LAB — FOLATE: Folate: >24.8 ng/mL (ref >5.9)

## 2012-07-22 LAB — HEMOGLOBIN A1C: Hgb A1c MFr Bld: 5.6 % (ref 4.6–6.5)

## 2012-07-22 LAB — VITAMIN B12: Vitamin B-12: 428 pg/mL (ref 211–911)

## 2012-07-22 LAB — HEPATITIS C ANTIBODY: HCV Ab: NEGATIVE

## 2012-07-22 LAB — FERRITIN: Ferritin: 74.4 ng/mL (ref 22.0–322.0)

## 2012-07-22 NOTE — Assessment & Plan Note (Signed)
I will recheck his LFT's and will screen him for viral hepatitis

## 2012-07-22 NOTE — Patient Instructions (Signed)
Health Maintenance, Males A healthy lifestyle and preventative care can promote health and wellness.  Maintain regular health, dental, and eye exams.  Eat a healthy diet. Foods like vegetables, fruits, whole grains, low-fat dairy products, and lean protein foods contain the nutrients you need without too many calories. Decrease your intake of foods high in solid fats, added sugars, and salt. Get information about a proper diet from your caregiver, if necessary.  Regular physical exercise is one of the most important things you can do for your health. Most adults should get at least 150 minutes of moderate-intensity exercise (any activity that increases your heart rate and causes you to sweat) each week. In addition, most adults need muscle-strengthening exercises on 2 or more days a week.   Maintain a healthy weight. The body mass index (BMI) is a screening tool to identify possible weight problems. It provides an estimate of body fat based on height and weight. Your caregiver can help determine your BMI, and can help you achieve or maintain a healthy weight. For adults 20 years and older:  A BMI below 18.5 is considered underweight.  A BMI of 18.5 to 24.9 is normal.  A BMI of 25 to 29.9 is considered overweight.  A BMI of 30 and above is considered obese.  Maintain normal blood lipids and cholesterol by exercising and minimizing your intake of saturated fat. Eat a balanced diet with plenty of fruits and vegetables. Blood tests for lipids and cholesterol should begin at age 20 and be repeated every 5 years. If your lipid or cholesterol levels are high, you are over 50, or you are a high risk for heart disease, you may need your cholesterol levels checked more frequently.Ongoing high lipid and cholesterol levels should be treated with medicines, if diet and exercise are not effective.  If you smoke, find out from your caregiver how to quit. If you do not use tobacco, do not start.  If you  choose to drink alcohol, do not exceed 2 drinks per day. One drink is considered to be 12 ounces (355 mL) of beer, 5 ounces (148 mL) of wine, or 1.5 ounces (44 mL) of liquor.  Avoid use of street drugs. Do not share needles with anyone. Ask for help if you need support or instructions about stopping the use of drugs.  High blood pressure causes heart disease and increases the risk of stroke. Blood pressure should be checked at least every 1 to 2 years. Ongoing high blood pressure should be treated with medicines if weight loss and exercise are not effective.  If you are 45 to 55 years old, ask your caregiver if you should take aspirin to prevent heart disease.  Diabetes screening involves taking a blood sample to check your fasting blood sugar level. This should be done once every 3 years, after age 45, if you are within normal weight and without risk factors for diabetes. Testing should be considered at a younger age or be carried out more frequently if you are overweight and have at least 1 risk factor for diabetes.  Colorectal cancer can be detected and often prevented. Most routine colorectal cancer screening begins at the age of 50 and continues through age 75. However, your caregiver may recommend screening at an earlier age if you have risk factors for colon cancer. On a yearly basis, your caregiver may provide home test kits to check for hidden blood in the stool. Use of a small camera at the end of a tube,   to directly examine the colon (sigmoidoscopy or colonoscopy), can detect the earliest forms of colorectal cancer. Talk to your caregiver about this at age 50, when routine screening begins. Direct examination of the colon should be repeated every 5 to 10 years through age 75, unless early forms of pre-cancerous polyps or small growths are found.  Hepatitis C blood testing is recommended for all people born from 1945 through 1965 and any individual with known risks for hepatitis C.  Healthy  men should no longer receive prostate-specific antigen (PSA) blood tests as part of routine cancer screening. Consult with your caregiver about prostate cancer screening.  Testicular cancer screening is not recommended for adolescents or adult males who have no symptoms. Screening includes self-exam, caregiver exam, and other screening tests. Consult with your caregiver about any symptoms you have or any concerns you have about testicular cancer.  Practice safe sex. Use condoms and avoid high-risk sexual practices to reduce the spread of sexually transmitted infections (STIs).  Use sunscreen with a sun protection factor (SPF) of 30 or greater. Apply sunscreen liberally and repeatedly throughout the day. You should seek shade when your shadow is shorter than you. Protect yourself by wearing long sleeves, pants, a wide-brimmed hat, and sunglasses year round, whenever you are outdoors.  Notify your caregiver of new moles or changes in moles, especially if there is a change in shape or color. Also notify your caregiver if a mole is larger than the size of a pencil eraser.  A one-time screening for abdominal aortic aneurysm (AAA) and surgical repair of large AAAs by sound wave imaging (ultrasonography) is recommended for ages 65 to 75 years who are current or former smokers.  Stay current with your immunizations. Document Released: 12/26/2007 Document Revised: 09/21/2011 Document Reviewed: 11/24/2010 [employer] Patient Information 2013 [employer].  

## 2012-07-22 NOTE — Patient Instructions (Addendum)
Your physician recommends that you schedule a follow-up appointment in: 2-3 MONTHS   Your physician recommends that you return for a FASTING lipid profile: NMR LIPIOMED

## 2012-07-22 NOTE — Assessment & Plan Note (Signed)
I will check his a1c to see if he has developed DM II 

## 2012-07-22 NOTE — Assessment & Plan Note (Signed)
Exam done Vaccines were updated Labs ordered Pt ed material was given 

## 2012-07-22 NOTE — Progress Notes (Signed)
Modesto Charon Date of Birth  November 07, 1955       [facility]    [place] [address]  [address] [place], [place]  [address]   [place], [place]  [address] [phone]     [phone]   Fax  [phone]    Fax [phone]  Problem List: 1. Coronary artery disease: -Status post myocardial infarction while slicing. He had a ventricular fibrillation cardiac arrest received CPR. He had a stent in his LAD .  Hx of LCx 2. Hyperlipidemia 3. Gout  History of Present Illness:  Gary Andrews is a 57 yo with hx of CAD  He has an old stent ( ? 2 stents) to the LCx.   He had been having angina while riding for two weeks prior to his recent MI.  He presented on November 3 with a cardiac arrest while cycling up a hill.  He had stopped taking his statin.   He was found to have an occluded LAD. He had PTCA and stenting of his left anterior descending artery. The stent to the left circumflex artery was still patent.  He was seen by Lawson Fiscal last week. He's overall feeling quite well. He's not had any episodes of chest pain or shortness of breath.  July 22, 2012: Tinsley has done well from a cardiac standpoint. He did have an acute gout attack. We called them and some colchicine and he seems to be doing much better. He has not seen Dr. Yetta Barre who will continue to follow him for his gout.  He's been riding some since I saw him. He rode approximately 25 miles on January 1. He felt well during the ride.  He's not had any episodes of chest pain, shortness breath, syncope or presyncope.  Current Outpatient Prescriptions on File Prior to Visit  Medication Sig Dispense Refill  . aspirin 81 MG tablet Take 1 tablet (81 mg total) by mouth daily.      Marland Kitchen atorvastatin (LIPITOR) 80 MG tablet Take 1 tablet (80 mg total) by mouth at bedtime.  30 tablet  6  . carvedilol (COREG) 3.125 MG tablet Take 1 tablet (3.125 mg total) by mouth 2 (two) times daily with a meal.  60 tablet  6  .  colchicine 0.6 MG tablet Take 1 tablet (0.6 mg total) by mouth 2 (two) times daily.  60 tablet  3  . lisinopril (PRINIVIL,ZESTRIL) 2.5 MG tablet Take 1 tablet (2.5 mg total) by mouth daily.  30 tablet  6  . minocycline (MINOCIN,DYNACIN) 100 MG capsule Take 100 mg by mouth daily.      . Multiple Vitamin (MULTIVITAMIN) capsule Take 1 capsule by mouth daily.      . nitroGLYCERIN (NITROSTAT) 0.4 MG SL tablet Place 1 tablet (0.4 mg total) under the tongue every 5 (five) minutes as needed for chest pain (up to 3 doses).  25 tablet  4  . prasugrel (EFFIENT) 10 MG TABS Take 1 tablet (10 mg total) by mouth daily.  30 tablet  6    No Known Allergies  Past Medical History  Diagnosis Date  . HTN (hypertension)   . Hyperlipidemia   . Atrial fibrillation     a. Noted at time of STEMI 05/2012, converted on amiodarone.  . Cardiac arrest     a. 05/2012 - came in as bystander CPR, pulses back before EMS arrived at time of STEMI.  . Ischemic cardiomyopathy     a. EF 35% by  cath at time of STEMI, improved to 50-55% at time of DC 05/2012.  . Transaminitis     a. At time of STEMI 05/2012 ?shock liver.  Marland Kitchen CAD (coronary artery disease)     a. 2012 s/p stenting x 2 (3 mos apart) [facility]. b. Acute ant-lat STEMI s/p DES to prox LAD 05/14/12, residual mod Cx dz for med rx - possible cardiac arrest (CPR).  . Gout 2012    Past Surgical History  Procedure Date  . Coronary angioplasty with stent placement 2013    History  Smoking status  . Never Smoker   Smokeless tobacco  . Never Used    History  Alcohol Use  . 1.8 oz/week  . 3 Cans of beer per week    Comment: occasional drink.    Family History  Problem Relation Age of Onset  . CAD Father     alive  . Heart disease Father   . Heart attack Father   . Stroke Father   . CAD Brother     alive  . Heart disease Brother   . Heart disease Brother   . Alcohol abuse Mother   . Alcohol abuse Sister   . Cancer Neg Hx   . Diabetes Neg Hx   .  Early death Neg Hx   . Hyperlipidemia Neg Hx   . Hypertension Neg Hx     Reviw of Systems:  Reviewed in the HPI.  All other systems are negative.  Physical Exam: Blood pressure 98/68, pulse 52, height 6' (1.829 m), weight 189 lb (85.73 kg). General: Well developed, well nourished, in no acute distress.  Head: Normocephalic, atraumatic, sclera non-icteric, mucus membranes are moist,   Neck: Supple. Carotids are 2 + without bruits. No JVD  Lungs: Clear bilaterally to auscultation.  Heart: regular rate.  normal  S1 S2. No murmurs, gallops or rubs.  Abdomen: Soft, non-tender, non-distended with normal bowel sounds. No hepatomegaly. No rebound/guarding. No masses.  Msk:  Strength and tone are normal  Extremities: No clubbing or cyanosis. No edema.  Distal pedal pulses are 2+ and equal bilaterally.  Neuro: Alert and oriented X 3. Moves all extremities spontaneously.  Psych:  Responds to questions appropriately with a normal affect.  ECG: 06/02/2012-normal sinus rhythm. He has an incomplete right bundle branch block.  Assessment / Plan:

## 2012-07-22 NOTE — Assessment & Plan Note (Signed)
I will recheck his CBC and will look at his vitamin levels as well 

## 2012-07-22 NOTE — Progress Notes (Signed)
Subjective:    Patient ID: Gary Andrews, male    DOB: October 23, 1955, 57 y.o.   MRN: [id number]  Arthritis Presents for follow-up visit. He complains of pain. He reports no stiffness, joint swelling or joint warmth. The symptoms have been improving. Affected locations include the right foot. His pain is at a severity of 1/10. Pertinent negatives include no diarrhea, dry eyes, dry mouth, dysuria, fatigue, fever, pain at night, pain while resting, rash, Raynaud's syndrome, uveitis or weight loss.      Review of Systems  Constitutional: Negative for fever, chills, weight loss, diaphoresis, activity change, appetite change, fatigue and unexpected weight change.  HENT: Negative.   Eyes: Negative.   Respiratory: Negative for cough, chest tightness, shortness of breath, wheezing and stridor.   Cardiovascular: Negative for chest pain, palpitations and leg swelling.  Gastrointestinal: Positive for blood in stool, anal bleeding and rectal pain. Negative for nausea, vomiting, abdominal pain, diarrhea, constipation and abdominal distention.  Genitourinary: Negative.  Negative for dysuria and difficulty urinating.  Musculoskeletal: Positive for arthralgias and arthritis. Negative for myalgias, back pain, joint swelling, gait problem and stiffness.  Skin: Negative for color change, pallor, rash and wound.  Neurological: Negative for dizziness, tremors, seizures, syncope, facial asymmetry, speech difficulty, weakness, light-headedness, numbness and headaches.  Hematological: Negative for adenopathy. Does not bruise/bleed easily.  Psychiatric/Behavioral: Negative.        Objective:   Physical Exam  Vitals reviewed. Constitutional: He is oriented to person, place, and time. He appears well-developed and well-nourished.  Non-toxic appearance. He does not have a sickly appearance. He does not appear ill. No distress.  HENT:  Head: Normocephalic and atraumatic.  Mouth/Throat: Oropharynx is clear and moist. No  oropharyngeal exudate.  Eyes: Conjunctivae normal are normal. Right eye exhibits no discharge. Left eye exhibits no discharge. No scleral icterus.  Neck: Normal range of motion. Neck supple. No JVD present. No tracheal deviation present. No thyromegaly present.  Cardiovascular: Normal rate, regular rhythm, normal heart sounds and intact distal pulses.  Exam reveals no gallop and no friction rub.   No murmur heard. Pulmonary/Chest: Effort normal and breath sounds normal. No stridor. No respiratory distress. He has no wheezes. He has no rales. He exhibits no tenderness.  Abdominal: Soft. Bowel sounds are normal. He exhibits no distension and no mass. There is no tenderness. There is no rebound and no guarding. Hernia confirmed negative in the right inguinal area and confirmed negative in the left inguinal area.  Genitourinary: Prostate normal, testes normal and penis normal. Rectal exam shows internal hemorrhoid. Rectal exam shows no external hemorrhoid, no fissure, no mass, no tenderness and anal tone normal. Guaiac positive stool. Prostate is not enlarged and not tender. Right testis shows no mass, no swelling and no tenderness. Right testis is descended. Left testis shows no mass, no swelling and no tenderness. Left testis is descended. Circumcised. No penile erythema or penile tenderness. No discharge found.  Musculoskeletal: Normal range of motion. He exhibits no edema.  Lymphadenopathy:    He has no cervical adenopathy.       Right: No inguinal adenopathy present.       Left: No inguinal adenopathy present.  Neurological: He is oriented to person, place, and time.  Skin: Skin is warm and dry. No rash noted. He is not diaphoretic. No erythema. No pallor.  Psychiatric: He has a normal mood and affect. His behavior is normal. Judgment and thought content normal.     Lab Results  Component Value Date  WBC 4.6 05/16/2012   HGB 13.0 05/16/2012   HCT 38.7* 05/16/2012   PLT 144* 05/16/2012    GLUCOSE 101* 06/02/2012   CHOL 136 06/02/2012   TRIG 114.0 06/02/2012   HDL 37.40* 06/02/2012   LDLCALC 76 06/02/2012   ALT 55* 06/02/2012   AST 42* 06/02/2012   NA 141 06/02/2012   K 3.9 06/02/2012   CL 105 06/02/2012   CREATININE 1.0 06/02/2012   BUN 18 06/02/2012   CO2 31 06/02/2012   TSH 1.296 05/14/2012   INR 1.16 05/14/2012   HGBA1C 5.5 05/15/2012       Assessment & Plan:

## 2012-07-22 NOTE — Assessment & Plan Note (Signed)
Cresencio is doing well. He's not had any episodes of chest pain or shortness of breath. He has been able to cycle without any significant problems.  We will draw a [facility] profile today to make sure that we are are being aggressive enough with his atorvastatin. If his particle number still elevated then we will probably switch to Crestor.  I'll see him again several months. We'll consider doing a stress test at that time if there is any doubt as to whether or not he can get back to cycling at his normal pace.

## 2012-07-22 NOTE — Assessment & Plan Note (Signed)
Uric acid = 6 so no need for uloric He is doing well with colchicine

## 2012-07-23 LAB — HEPATITIS B CORE ANTIBODY, TOTAL: Hep B Core Total Ab: NEGATIVE

## 2012-07-23 LAB — HEPATITIS A ANTIBODY, TOTAL: Hep A Total Ab: NEGATIVE

## 2012-07-25 LAB — HEPATITIS B SURFACE ANTIBODY,QUALITATIVE: Hep B S Ab: NONREACTIVE

## 2012-07-26 ENCOUNTER — Telehealth: Payer: Self-pay | Admitting: *Deleted

## 2012-07-26 NOTE — Telephone Encounter (Signed)
Message copied by Antony Odea on Tue Jul 26, 2012  1:26 PM ------      Message from: Vesta Mixer      Created: Sat Jul 23, 2012  9:53 PM      Regarding: FW: Cancellation of Order # [id number]       Was the Lipomed cancelled????.  We will need him to return to have this drawn again if this was not sent.                  ----- Message -----         From: [facility]         Sent: 07/23/2012   1:04 AM           To: Vesta Mixer, MD      Subject: Cancellation of Order # [id number]                         Order number [id number] for the procedure NMR, LIPOPROFILE       [[id number]] has been canceled by [facility]       [[id number]]. This procedure was ordered by Vesta Mixer, MD       [[id number]] on Jul 22, 2012 for the patient Gary Andrews       [[id number]]. The reason for cancellation was "None".

## 2012-07-26 NOTE — Telephone Encounter (Signed)
Took order to lab/ Mellody Dance reviewed and called lipomed science, they will do test per [facility].

## 2012-07-28 LAB — NMR, LIPOPROFILE

## 2012-08-02 ENCOUNTER — Telehealth: Payer: Self-pay | Admitting: Cardiovascular Disease

## 2012-08-02 DIAGNOSIS — E785 Hyperlipidemia, unspecified: Secondary | ICD-10-CM

## 2012-08-02 DIAGNOSIS — I2510 Atherosclerotic heart disease of native coronary artery without angina pectoris: Secondary | ICD-10-CM

## 2012-08-02 MED ORDER — ROSUVASTATIN CALCIUM 20 MG PO TABS: 20.0000 mg | ORAL_TABLET | Freq: Every day | ORAL | Status: DC

## 2012-08-02 NOTE — Telephone Encounter (Signed)
Pt nmr results given, he will switch meds when finishes atorvastatin- just purchased 30 day supply. MAR adjusted, nmr ordered for return ov.

## 2012-08-02 NOTE — Telephone Encounter (Signed)
F/u   Return call back to nurse

## 2012-09-08 ENCOUNTER — Ambulatory Visit (INDEPENDENT_AMBULATORY_CARE_PROVIDER_SITE_OTHER)
Admission: RE | Admit: 2012-09-08 | Discharge: 2012-09-08 | Disposition: A | Discharge status: Home / Self Care | Payer: BC Managed Care – PPO | Source: Ambulatory Visit | Attending: Internal Medicine | Admitting: Internal Medicine

## 2012-09-08 ENCOUNTER — Ambulatory Visit (INDEPENDENT_AMBULATORY_CARE_PROVIDER_SITE_OTHER): Payer: BC Managed Care – PPO | Admitting: Internal Medicine

## 2012-09-08 ENCOUNTER — Telehealth: Payer: Self-pay | Admitting: Internal Medicine

## 2012-09-08 VITALS — BP 118/72 | HR 56 | Temp 97.0°F | Resp 16 | Wt 188.0 lb

## 2012-09-08 DIAGNOSIS — R071 Chest pain on breathing: Secondary | ICD-10-CM

## 2012-09-08 DIAGNOSIS — R0789 Other chest pain: Secondary | ICD-10-CM

## 2012-09-08 MED ORDER — CELECOXIB 200 MG PO CAPS: 200.0000 mg | ORAL_CAPSULE | Freq: Two times a day (BID) | ORAL | Status: DC

## 2012-09-08 MED ORDER — CELECOXIB 200 MG PO CAPS: 200.0000 mg | ORAL_CAPSULE | Freq: Every day | ORAL | Status: DC

## 2012-09-08 NOTE — Telephone Encounter (Signed)
Patient Information:  Caller Name: Leonidus  Phone: [phone]  Patient: Gary Andrews, Gary Andrews  Gender: Male  DOB: 1955/10/17  Age: 57 Years  PCP: Sanda Linger (Adults only)  Office Follow Up:  Does the office need to follow up with this patient?: No  Instructions For The Office: N/A  RN Note:  pt has appt scheduled for 09/16/12 but was not sure if he should wait that long to be seen.  RN advised that patient should be seen today. Pt agreeable.  Symptoms  Reason For Call & Symptoms: caller reports he is having intermittent pains under ribs on right side.  Caller reports he has never experienced pain like this (stabbing and tender to touch).  Pt rates pain as 3/10.  Pt reports he had an MI on 05/14/12 and the pain does not feel the same.  But he is very aware of any pain now in his chest.  Reviewed Health History In EMR: Yes  Reviewed Medications In EMR: Yes  Reviewed Allergies In EMR: Yes  Reviewed Surgeries / Procedures: Yes  Date of Onset of Symptoms: 09/03/2012  Treatments Tried: Tylenol  Treatments Tried Worked: No  Guideline(s) Used:  Chest Pain  Disposition Per Guideline:   See Today in Office  Reason For Disposition Reached:   Intermittent chest pains persist > 3 days  Advice Given:  Call Back If:  You become worse.  Appointment Scheduled:  09/08/2012 16:15:00 Appointment Scheduled Provider:  Sanda Linger (Adults only)

## 2012-09-08 NOTE — Patient Instructions (Signed)
Chest Wall Pain Chest wall pain is pain in or around the bones and muscles of your chest. It may take up to 6 weeks to get better. It may take longer if you must stay physically active in your work and activities.  CAUSES  Chest wall pain may happen on its own. However, it may be caused by:  A viral illness like the flu.  Injury.  Coughing.  Exercise.  Arthritis.  Fibromyalgia.  Shingles. HOME CARE INSTRUCTIONS   Avoid overtiring physical activity. Try not to strain or perform activities that cause pain. This includes any activities using your chest or your abdominal and side muscles, especially if heavy weights are used.  Put ice on the sore area.  Put ice in a plastic bag.  Place a towel between your skin and the bag.  Leave the ice on for 15 to 20 minutes per hour while awake for the first 2 days.  Only take over-the-counter or prescription medicines for pain, discomfort, or fever as directed by your caregiver. SEEK IMMEDIATE MEDICAL CARE IF:   Your pain increases, or you are very uncomfortable.  You have a fever.  Your chest pain becomes worse.  You have new, unexplained symptoms.  You have nausea or vomiting.  You feel sweaty or lightheaded.  You have a cough with phlegm (sputum), or you cough up blood. MAKE SURE YOU:   Understand these instructions.  Will watch your condition.  Will get help right away if you are not doing well or get worse. Document Released: 06/29/2005 Document Revised: 09/21/2011 Document Reviewed: 02/23/2011 [employer] Patient Information 2013 [employer].  

## 2012-09-09 ENCOUNTER — Encounter: Payer: Self-pay | Admitting: Internal Medicine

## 2012-09-11 ENCOUNTER — Encounter: Payer: Self-pay | Admitting: Internal Medicine

## 2012-09-11 NOTE — Progress Notes (Signed)
  Subjective:    Patient ID: Gary Andrews, male    DOB: 21-Oct-1955, 57 y.o.   MRN: [id number]  Chest Pain  This is a new problem. Episode onset: one week. The onset quality is gradual. The problem has been gradually improving. The pain is present in the lateral region (right mid chest wall). The pain is at a severity of 1/10. The pain is mild. Quality: "soreness and cramping" The pain does not radiate. Pertinent negatives include no abdominal pain, back pain, claudication, cough, diaphoresis, dizziness, exertional chest pressure, fever, headaches, hemoptysis, irregular heartbeat, leg pain, lower extremity edema, malaise/fatigue, nausea, near-syncope, numbness, orthopnea, palpitations, PND, shortness of breath, sputum production, syncope, vomiting or weakness. The pain is aggravated by movement and lifting arm (palpation). He has tried acetaminophen for the symptoms. The treatment provided moderate relief.  His past medical history is significant for CAD.      Review of Systems  Constitutional: Negative for fever, chills, malaise/fatigue, diaphoresis, activity change, appetite change, fatigue and unexpected weight change.  HENT: Negative.   Eyes: Negative.   Respiratory: Negative for cough, hemoptysis, sputum production, chest tightness, shortness of breath, wheezing and stridor.   Cardiovascular: Positive for chest pain. Negative for palpitations, orthopnea, claudication, leg swelling, syncope, PND and near-syncope.  Gastrointestinal: Negative.  Negative for nausea, vomiting and abdominal pain.  Endocrine: Negative.   Genitourinary: Negative.   Musculoskeletal: Negative.  Negative for back pain.  Skin: Negative.   Allergic/Immunologic: Negative.   Neurological: Negative.  Negative for dizziness, weakness, light-headedness, numbness and headaches.  Hematological: Negative for adenopathy. Does not bruise/bleed easily.  Psychiatric/Behavioral: Negative.        Objective:   Physical Exam    Vitals reviewed. Constitutional: He is oriented to person, place, and time. He appears well-developed and well-nourished. No distress.  HENT:  Head: Normocephalic and atraumatic.  Mouth/Throat: Oropharynx is clear and moist. No oropharyngeal exudate.  Eyes: Conjunctivae are normal. Right eye exhibits no discharge. Left eye exhibits no discharge. No scleral icterus.  Neck: Normal range of motion. Neck supple. No JVD present. No tracheal deviation present. No thyromegaly present.  Cardiovascular: Normal rate, regular rhythm, normal heart sounds and intact distal pulses.  Exam reveals no gallop and no friction rub.   No murmur heard. Pulmonary/Chest: Effort normal and breath sounds normal. No accessory muscle usage or stridor. Not tachypneic. No respiratory distress. He has no decreased breath sounds. He has no wheezes. He has no rhonchi. He has no rales. Chest wall is not dull to percussion. He exhibits tenderness and bony tenderness. He exhibits no mass, no laceration, no crepitus, no edema, no deformity, no swelling and no retraction.    Abdominal: Soft. Bowel sounds are normal. He exhibits no distension and no mass. There is no tenderness. There is no rebound and no guarding.  Musculoskeletal: Normal range of motion. He exhibits no edema and no tenderness.  Lymphadenopathy:    He has no cervical adenopathy.  Neurological: He is oriented to person, place, and time.  Skin: Skin is warm and dry. No rash noted. He is not diaphoretic. No erythema. No pallor.  Psychiatric: He has a normal mood and affect. His behavior is normal. Judgment and thought content normal.          Assessment & Plan:

## 2012-09-11 NOTE — Assessment & Plan Note (Signed)
The pain does not sound cardiac, it DOES sound musculoskeletal (CXR is normal) I have asked him to try celebrex

## 2012-09-16 ENCOUNTER — Ambulatory Visit: Payer: BC Managed Care – PPO | Admitting: Internal Medicine

## 2012-10-07 ENCOUNTER — Ambulatory Visit (INDEPENDENT_AMBULATORY_CARE_PROVIDER_SITE_OTHER): Payer: BC Managed Care – PPO | Admitting: Cardiovascular Disease

## 2012-10-07 ENCOUNTER — Encounter: Payer: Self-pay | Admitting: Cardiovascular Disease

## 2012-10-07 ENCOUNTER — Other Ambulatory Visit (INDEPENDENT_AMBULATORY_CARE_PROVIDER_SITE_OTHER): Payer: BC Managed Care – PPO

## 2012-10-07 VITALS — BP 106/78 | HR 75 | Ht 72.0 in | Wt 185.0 lb

## 2012-10-07 DIAGNOSIS — E785 Hyperlipidemia, unspecified: Secondary | ICD-10-CM

## 2012-10-07 DIAGNOSIS — I2510 Atherosclerotic heart disease of native coronary artery without angina pectoris: Secondary | ICD-10-CM

## 2012-10-07 DIAGNOSIS — R0989 Other specified symptoms and signs involving the circulatory and respiratory systems: Secondary | ICD-10-CM

## 2012-10-07 DIAGNOSIS — Z9861 Coronary angioplasty status: Secondary | ICD-10-CM

## 2012-10-07 LAB — BASIC METABOLIC PANEL
BUN: 18 mg/dL (ref 6–23)
CO2: 30 mEq/L (ref 19–32)
Calcium: 9.2 mg/dL (ref 8.4–10.5)
Chloride: 98 mEq/L (ref 96–112)
Creatinine, Ser: 1 mg/dL (ref 0.4–1.5)
GFR: 79.67 mL/min (ref >60.00)
Glucose, Bld: 95 mg/dL (ref 70–99)
Potassium: 4.1 mEq/L (ref 3.5–5.1)
Sodium: 138 mEq/L (ref 135–145)

## 2012-10-07 LAB — HEPATIC FUNCTION PANEL
ALT: 36 U/L (ref 0–53)
AST: 32 U/L (ref 0–37)
Albumin: 4.3 g/dL (ref 3.5–5.2)
Alkaline Phosphatase: 61 U/L (ref 39–117)
Bilirubin, Direct: 0.2 mg/dL (ref 0.0–0.3)
Total Bilirubin: 1.1 mg/dL (ref 0.3–1.2)
Total Protein: 7.4 g/dL (ref 6.0–8.3)

## 2012-10-07 NOTE — Progress Notes (Signed)
Gary Andrews Date of Birth  February 10, 1956       [facility]    [place] [address]  [address] [place], [place]  [address]   [place], [place]  [address] [phone]     [phone]   Fax  [phone]    Fax [phone]  Problem List: 1. Coronary artery disease: -Status post myocardial infarction while slicing. He had a ventricular fibrillation cardiac arrest received CPR. He had a stent in his LAD .  Hx of LCx 2. Hyperlipidemia 3. Gout  History of Present Illness:  Gary Andrews is a 57 yo with hx of CAD  He has an old stent ( ? 2 stents) to the LCx.   He had been having angina while riding for two weeks prior to his recent MI.  He presented on November 3 with a cardiac arrest while cycling up a hill.  He had stopped taking his statin.   He was found to have an occluded LAD. He had PTCA and stenting of his left anterior descending artery. The stent to the left circumflex artery was still patent.  He was seen by Lawson Fiscal last week. He's overall feeling quite well. He's not had any episodes of chest pain or shortness of breath.  July 22, 2012: Gary Andrews has done well from a cardiac standpoint. He did have an acute gout attack. We called them and some colchicine and he seems to be doing much better. He has not seen Dr. Yetta Barre who will continue to follow him for his gout.  He's been riding some since I saw him. He rode approximately 25 miles on January 1. He felt well during the ride.  He's not had any episodes of chest pain, shortness breath, syncope or presyncope.  October 07, 2012:  Gary Andrews has done well from a cardiac standpoint.  No angina.  Breathing OK.  He has had some nausea and vomitting.   Current Outpatient Prescriptions on File Prior to Visit  Medication Sig Dispense Refill  . aspirin 81 MG tablet Take 1 tablet (81 mg total) by mouth daily.      . carvedilol (COREG) 3.125 MG tablet Take 1 tablet (3.125 mg total) by mouth 2 (two) times daily with  a meal.  60 tablet  6  . celecoxib (CELEBREX) 200 MG capsule Take 1 capsule (200 mg total) by mouth daily.  15 capsule  0  . colchicine 0.6 MG tablet Take 1 tablet (0.6 mg total) by mouth 2 (two) times daily.  60 tablet  3  . lisinopril (PRINIVIL,ZESTRIL) 2.5 MG tablet Take 1 tablet (2.5 mg total) by mouth daily.  30 tablet  6  . minocycline (MINOCIN,DYNACIN) 100 MG capsule Take 100 mg by mouth daily.      . Multiple Vitamin (MULTIVITAMIN) capsule Take 1 capsule by mouth daily.      . nitroGLYCERIN (NITROSTAT) 0.4 MG SL tablet Place 1 tablet (0.4 mg total) under the tongue every 5 (five) minutes as needed for chest pain (up to 3 doses).  25 tablet  4  . prasugrel (EFFIENT) 10 MG TABS Take 1 tablet (10 mg total) by mouth daily.  30 tablet  6  . rosuvastatin (CRESTOR) 20 MG tablet Take 1 tablet (20 mg total) by mouth daily.  30 tablet  6   No current facility-administered medications on file prior to visit.    No Known Allergies  Past Medical History  Diagnosis Date  . HTN (hypertension)   .  Hyperlipidemia   . Atrial fibrillation     a. Noted at time of STEMI 05/2012, converted on amiodarone.  . Cardiac arrest     a. 05/2012 - came in as bystander CPR, pulses back before EMS arrived at time of STEMI.  . Ischemic cardiomyopathy     a. EF 35% by cath at time of STEMI, improved to 50-55% at time of DC 05/2012.  . Transaminitis     a. At time of STEMI 05/2012 ?shock liver.  Marland Kitchen CAD (coronary artery disease)     a. 2012 s/p stenting x 2 (3 mos apart) [facility]. b. Acute ant-lat STEMI s/p DES to prox LAD 05/14/12, residual mod Cx dz for med rx - possible cardiac arrest (CPR).  . Gout 2012    Past Surgical History  Procedure Laterality Date  . Coronary angioplasty with stent placement  2013    History  Smoking status  . Never Smoker   Smokeless tobacco  . Never Used    History  Alcohol Use  . 1.8 oz/week  . 3 Cans of beer per week    Comment: occasional drink.    Family  History  Problem Relation Age of Onset  . CAD Father     alive  . Heart disease Father   . Heart attack Father   . Stroke Father   . CAD Brother     alive  . Heart disease Brother   . Heart disease Brother   . Alcohol abuse Mother   . Alcohol abuse Sister   . Cancer Neg Hx   . Diabetes Neg Hx   . Early death Neg Hx   . Hyperlipidemia Neg Hx   . Hypertension Neg Hx     Reviw of Systems:  Reviewed in the HPI.  All other systems are negative.  Physical Exam: Blood pressure 106/78, pulse 75, height 6' (1.829 m), weight 185 lb (83.915 kg), SpO2 98.00%. General: Well developed, well nourished, in no acute distress.  Head: Normocephalic, atraumatic, sclera non-icteric, mucus membranes are moist,   Neck: Supple. Carotids are 2 + without bruits. No JVD  Lungs: Clear bilaterally to auscultation.  Heart: regular rate.  normal  S1 S2. No murmurs, gallops or rubs.  Abdomen: Soft, non-tender, non-distended with normal bowel sounds. No hepatomegaly. No rebound/guarding. No masses.  Msk:  Strength and tone are normal  Extremities: No clubbing or cyanosis. No edema.  Distal pedal pulses are 2+ and equal bilaterally.  Neuro: Alert and oriented X 3. Moves all extremities spontaneously.  Psych:  Responds to questions appropriately with a normal affect.  ECG: 06/02/2012-normal sinus rhythm. He has an incomplete right bundle branch block.  Assessment / Plan:

## 2012-10-07 NOTE — Patient Instructions (Addendum)
Your physician recommends that you return for a FASTING lipid profile: today and in 6 months   Your physician wants you to follow-up in: 6 months with ekg You will receive a reminder letter in the mail two months in advance. If you don't receive a letter, please call our office to schedule the follow-up appointment.  Your physician recommends that you continue on your current medications as directed. Please refer to the Current Medication list given to you today.  [facility] Low Glycemic Diet (Source: [facility], 2006) Low Glycemic Foods (20-49) (Decrease risk of developing heart disease) Breakfast Cereals: All-Bran All-Bran Fruit 'n Oats Fiber One Oatmeal (not instant) Oat bran Fruits and fruit juices: (Limit to 1-2 servings per day) Apples Apricots (fresh & dried) Blackberries Blueberries Cherries Cranberries Peaches Pears Plums Prunes Grapefruit Raspberries Strawberries Tangerine Apple juice Grapefruit juice Tomato juice Beans and legumes (fresh-cooked): Black-eyed peas Butter beans Chick peas Lentils  Green beans Lima beans Kidney beans Navy beans Pinto beans Snow peas Non-starchy vegetables: Asparagus, avocado, broccoli, cabbage, cauliflower, celery, cucumber, greens, lettuce, mushrooms, peppers, tomatoes, okra, onions, spinach, summer squash Grains: Barley Bulgur Rye Wild rice Nuts and oils : Almonds Peanuts Sunflower seeds Hazelnuts Pecans Walnuts Oils that are liquid at room temperature Dairy, fish, meat, soy, and eggs: Milk, skim Lowfat cheese Yogurt, lowfat, fruit sugar sweetened Lean red meat Fish  Skinless chicken & [place] Shellfish Egg whites (up to 3 daily) Soy products  Egg yolks (up to 7 or _____ per week) Moderate Glycemic Foods (50-69) Breakfast Cereals: Bran Buds Bran Chex Just Right Mini-Wheats  Special K Swiss muesli Fruits: Banana (under-ripe) Dates Figs Grapes Kiwi Mango Oranges Raisins Fruit Juices: Cranberry  juice Orange juice Beans and legumes: Boston-type baked beans Canned pinto, kidney, or navy beans Green peas Vegetables: Beets Carrots  Sweet potato Yam Corn on the cob Breads: Pita (pocket) bread Oat bran bread Pumpernickel bread Rye bread Wheat bread, high fiber  Grains: Cornmeal Rice, brown Rice, white Couscous Pasta: Macaroni Pizza, cheese Ravioli, meat filled Spaghetti, white  Nuts: Cashews Macadamia Snacks: Chocolate Ice cream, lowfat Muffin Popcorn High Glycemic Foods (70-100)  Breakfast Cereals: Cheerios Corn Chex Corn Flakes Cream of Wheat Grape Nuts Grape Nut Flakes Grits Nutri-Grain Puffed Rice Puffed Wheat Rice Chex Rice Krispies Shredded Wheat Team Total Fruits: Pineapple Watermelon Banana (over-ripe) Beverages: Sodas, sweet tea, pineapple juice Vegetables: Potato, baked, boiled, fried, mashed [place] fries Canned or frozen corn Parsnips Winter squash Breads: Most breads (white and whole grain) Bagels Bread sticks Bread stuffing Kaiser roll Dinner rolls Grains: Rice, instant Tapioca, with milk Candy and most cookies Snacks: Donuts Corn chips Jelly beans Pretzels Pastries

## 2012-10-10 DIAGNOSIS — E785 Hyperlipidemia, unspecified: Secondary | ICD-10-CM | POA: Insufficient documentation

## 2012-10-10 NOTE — Assessment & Plan Note (Signed)
We have changed his atorvastatin to Crestor.  His goal LDL particle number is < 1000

## 2012-10-10 NOTE — Assessment & Plan Note (Signed)
Gary Andrews is doing well.  He has not had any angina.  Will continue with his current medications.   We will be very aggressive with his lipid lowering therapy.

## 2013-02-05 ENCOUNTER — Other Ambulatory Visit: Payer: Self-pay | Admitting: Physician Assistant

## 2013-05-18 ENCOUNTER — Other Ambulatory Visit: Payer: Self-pay

## 2013-06-30 ENCOUNTER — Other Ambulatory Visit: Payer: Self-pay | Admitting: *Deleted

## 2013-06-30 DIAGNOSIS — E785 Hyperlipidemia, unspecified: Secondary | ICD-10-CM

## 2013-06-30 DIAGNOSIS — I2510 Atherosclerotic heart disease of native coronary artery without angina pectoris: Secondary | ICD-10-CM

## 2013-06-30 MED ORDER — ROSUVASTATIN CALCIUM 20 MG PO TABS: 20.0000 mg | ORAL_TABLET | Freq: Every day | ORAL | Status: DC

## 2013-07-11 ENCOUNTER — Ambulatory Visit (INDEPENDENT_AMBULATORY_CARE_PROVIDER_SITE_OTHER): Payer: BC Managed Care – PPO | Admitting: Internal Medicine

## 2013-07-11 ENCOUNTER — Encounter: Payer: Self-pay | Admitting: Internal Medicine

## 2013-07-11 ENCOUNTER — Ambulatory Visit (INDEPENDENT_AMBULATORY_CARE_PROVIDER_SITE_OTHER)
Admission: RE | Admit: 2013-07-11 | Discharge: 2013-07-11 | Disposition: A | Discharge status: Home / Self Care | Payer: BC Managed Care – PPO | Source: Ambulatory Visit | Attending: Internal Medicine | Admitting: Internal Medicine

## 2013-07-11 VITALS — BP 118/82 | HR 63 | Temp 98.1°F | Resp 16 | Ht 72.0 in | Wt 184.0 lb

## 2013-07-11 DIAGNOSIS — M25571 Pain in right ankle and joints of right foot: Secondary | ICD-10-CM

## 2013-07-11 DIAGNOSIS — M25579 Pain in unspecified ankle and joints of unspecified foot: Secondary | ICD-10-CM | POA: Insufficient documentation

## 2013-07-11 DIAGNOSIS — D229 Melanocytic nevi, unspecified: Secondary | ICD-10-CM

## 2013-07-11 DIAGNOSIS — D239 Other benign neoplasm of skin, unspecified: Secondary | ICD-10-CM

## 2013-07-11 MED ORDER — DICLOFENAC 35 MG PO CAPS: 1.0000 | ORAL_CAPSULE | Freq: Three times a day (TID) | ORAL | Status: DC | PRN

## 2013-07-11 NOTE — Progress Notes (Signed)
Pre visit review using our clinic review tool, if applicable. No additional management support is needed unless otherwise documented below in the visit note. 

## 2013-07-11 NOTE — Assessment & Plan Note (Signed)
Plain film is normal I am most concerned about achilles tendonitis and plantar fasciitis so I have asked him to RICE and try a few doses of nsaids

## 2013-07-11 NOTE — Assessment & Plan Note (Signed)
Derm referral.

## 2013-07-11 NOTE — Progress Notes (Signed)
Subjective:    Patient ID: Gary Andrews, male    DOB: July 08, 1956, 57 y.o.   MRN: [id number]  HPI Comments: He returns complaining of a vague/diffuse pain over his right posterior ankle and heel for three weeks. He thought at first that it might be gout though there was no swelling or redness, he took colchicine without much relief from the pain. He does not recall any specific trauma or injury but he is a long distance biker.     Review of Systems  Constitutional: Negative.  Negative for chills, diaphoresis and fatigue.  HENT: Negative.   Eyes: Negative.   Respiratory: Negative.   Cardiovascular: Negative.   Gastrointestinal: Negative.   Endocrine: Negative.   Genitourinary: Negative.   Musculoskeletal: Positive for arthralgias.  Skin: Negative.  Negative for rash.  Allergic/Immunologic: Negative.   Neurological: Negative.   Hematological: Negative.  Negative for adenopathy.  Psychiatric/Behavioral: Negative.        Objective:   Physical Exam  Vitals reviewed. Constitutional: He is oriented to person, place, and time. He appears well-developed and well-nourished. No distress.  HENT:  Head: Normocephalic and atraumatic.  Mouth/Throat: Oropharynx is clear and moist. No oropharyngeal exudate.  Eyes: Conjunctivae are normal. Right eye exhibits no discharge. Left eye exhibits no discharge. No scleral icterus.  Neck: Neck supple. No JVD present. No tracheal deviation present. No thyromegaly present.  Cardiovascular: Normal rate, regular rhythm, normal heart sounds and intact distal pulses.  Exam reveals no gallop and no friction rub.   No murmur heard. Pulses:      Popliteal pulses are 2+ on the right side, and 2+ on the left side.       Dorsalis pedis pulses are 2+ on the right side, and 2+ on the left side.       Posterior tibial pulses are 2+ on the right side, and 2+ on the left side.  Pulmonary/Chest: Effort normal and breath sounds normal. No stridor. No respiratory  distress. He has no wheezes. He has no rales. He exhibits no tenderness.  Abdominal: Soft. Bowel sounds are normal. He exhibits no distension and no mass. There is no tenderness. There is no rebound and no guarding.  Musculoskeletal: Normal range of motion. He exhibits no edema and no tenderness.       Right ankle: Normal. He exhibits normal range of motion, no swelling, no ecchymosis, no deformity, no laceration and normal pulse. No tenderness. Achilles tendon normal. Achilles tendon exhibits no pain, no defect and normal Thompson's test results.       Right foot: He exhibits normal range of motion, no bony tenderness, no swelling, normal capillary refill, no crepitus, no deformity and no laceration.  There is mild diffuse tenderness over the calcaneous over the sides and on the plantar surface.  Lymphadenopathy:    He has no cervical adenopathy.  Neurological: He is oriented to person, place, and time.  Skin: Skin is warm and dry. No rash noted. He is not diaphoretic. No erythema. No pallor.     Psychiatric: He has a normal mood and affect. His behavior is normal. Judgment and thought content normal.     Lab Results  Component Value Date   WBC 4.0* 07/22/2012   HGB 14.9 07/22/2012   HCT 44.2 07/22/2012   PLT 185.0 07/22/2012   GLUCOSE 95 10/07/2012   CHOL 136 06/02/2012   TRIG 114.0 06/02/2012   HDL 37.40* 06/02/2012   LDLCALC 76 06/02/2012   ALT 36 10/07/2012  AST 32 10/07/2012   NA 138 10/07/2012   K 4.1 10/07/2012   CL 98 10/07/2012   CREATININE 1.0 10/07/2012   BUN 18 10/07/2012   CO2 30 10/07/2012   TSH 1.73 07/22/2012   PSA 0.90 07/22/2012   INR 1.16 05/14/2012   HGBA1C 5.6 07/22/2012       Assessment & Plan:

## 2013-07-11 NOTE — Patient Instructions (Signed)
Achilles Tendinitis Tendinitis a swelling and soreness of the tendon. The pain in the tendon (cord-like structure which attaches muscle to bone) is produced by tiny tears and the inflammation present in that tendon. It commonly occurs at the shoulders, heels, and elbows. It is usually caused by overusing the tendon and joint involved. Achilles tendinitis involves the Achilles tendon. This is the large tendon in the back of the leg just above the foot. It attaches the large muscles of the lower leg to the heel bone (called calcaneus).  This diagnosis (learning what is wrong) is made by examination. X-rays will be generally be normal if only tendinitis is present. HOME CARE INSTRUCTIONS   Apply ice to the injury for 15-20 minutes, 03-04 times per day. Put the ice in a plastic bag and place a towel between the bag of ice and your skin.  Try to avoid use other than gentle range of motion while the tendon is painful. Do not resume use until instructed by your caregiver. Then begin use gradually. Do not increase use to the point of pain. If pain does develop, decrease use and continue the above measures. Gradually increase activities that do not cause discomfort until you gradually achieve normal use.  Only take over-the-counter or prescription medicines for pain, discomfort, or fever as directed by your caregiver. SEEK MEDICAL CARE IF:   Your pain and swelling increase or pain is uncontrolled with medications.  You develop new, unexplained problems (symptoms) or an increase of the symptoms that brought you to your caregiver.  You develop an inability to move your toes or foot, develop warmth and swelling in your foot, or begin running an unexplained temperature. MAKE SURE YOU:   Understand these instructions.  Will watch your condition.  Will get help right away if you are not doing well or get worse. Document Released: 04/08/2005 Document Revised: 09/21/2011 Document Reviewed:  02/08/2013 [facility] Patient Information 2014 [place], [place].

## 2013-07-26 ENCOUNTER — Ambulatory Visit: Payer: BC Managed Care – PPO | Admitting: Cardiovascular Disease

## 2013-07-26 ENCOUNTER — Encounter: Payer: Self-pay | Admitting: Nurse Practitioner

## 2013-07-26 ENCOUNTER — Ambulatory Visit (INDEPENDENT_AMBULATORY_CARE_PROVIDER_SITE_OTHER): Payer: BC Managed Care – PPO | Admitting: Nurse Practitioner

## 2013-07-26 ENCOUNTER — Other Ambulatory Visit: Payer: Self-pay | Admitting: *Deleted

## 2013-07-26 VITALS — BP 110/70 | HR 61 | Wt 190.4 lb

## 2013-07-26 DIAGNOSIS — E785 Hyperlipidemia, unspecified: Secondary | ICD-10-CM

## 2013-07-26 DIAGNOSIS — I2510 Atherosclerotic heart disease of native coronary artery without angina pectoris: Secondary | ICD-10-CM

## 2013-07-26 DIAGNOSIS — M109 Gout, unspecified: Secondary | ICD-10-CM

## 2013-07-26 DIAGNOSIS — E782 Mixed hyperlipidemia: Secondary | ICD-10-CM

## 2013-07-26 LAB — LIPID PANEL
Cholesterol: 230 mg/dL — ABNORMAL HIGH (ref 0–200)
HDL: 43.7 mg/dL (ref >39.00)
Total CHOL/HDL Ratio: 5
Triglycerides: 105 mg/dL (ref 0.0–149.0)
VLDL: 21 mg/dL (ref 0.0–40.0)

## 2013-07-26 LAB — BASIC METABOLIC PANEL
BUN: 17 mg/dL (ref 6–23)
CO2: 30 mEq/L (ref 19–32)
Calcium: 9.5 mg/dL (ref 8.4–10.5)
Chloride: 108 mEq/L (ref 96–112)
Creatinine, Ser: 0.8 mg/dL (ref 0.4–1.5)
GFR: 104.83 mL/min (ref >60.00)
Glucose, Bld: 86 mg/dL (ref 70–99)
Potassium: 4.4 mEq/L (ref 3.5–5.1)
Sodium: 142 mEq/L (ref 135–145)

## 2013-07-26 LAB — HEPATIC FUNCTION PANEL
ALT: 33 U/L (ref 0–53)
AST: 30 U/L (ref 0–37)
Albumin: 4.4 g/dL (ref 3.5–5.2)
Alkaline Phosphatase: 57 U/L (ref 39–117)
Bilirubin, Direct: 0.1 mg/dL (ref 0.0–0.3)
Total Bilirubin: 0.8 mg/dL (ref 0.3–1.2)
Total Protein: 7.2 g/dL (ref 6.0–8.3)

## 2013-07-26 LAB — LDL CHOLESTEROL, DIRECT: Direct LDL: 167.3 mg/dL

## 2013-07-26 MED ORDER — COLCHICINE 0.6 MG PO TABS: 0.6000 mg | ORAL_TABLET | ORAL | Status: DC | PRN

## 2013-07-26 MED ORDER — ROSUVASTATIN CALCIUM 20 MG PO TABS: 20.0000 mg | ORAL_TABLET | Freq: Every day | ORAL | Status: DC

## 2013-07-26 NOTE — Patient Instructions (Addendum)
We need to check labs today  Stay on your current medicines - send Gary Andrews an email regarding your medicines so we can make sure they are correct  See Dr. Acie Fredrickson back in 6 months - call in May for a July visit   Call the [place] office at [phone] if you have any questions, problems or concerns.
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
## 2013-07-28 ENCOUNTER — Telehealth: Payer: Self-pay | Admitting: *Deleted

## 2013-07-28 NOTE — Telephone Encounter (Signed)
S/w pt stated s/w someone at this office Corwin Levins, RN, taken Crestor off his list pt was out, according to lab work pt needed to go back on, pt agreed, I sent in a script for Crestor ( 20 mg ) to pharm and Cohilcine (0.6 mg). Pt was concerned why pt's email on mychart did not come to [place], I stated i would research and call pt back, pt stated out of town till next week will call back than. Cecille Rubin stated pt needs to come back in 6 weeks to get lipids check

## 2013-07-31 ENCOUNTER — Other Ambulatory Visit: Payer: Self-pay | Admitting: *Deleted

## 2013-07-31 DIAGNOSIS — E785 Hyperlipidemia, unspecified: Secondary | ICD-10-CM

## 2013-08-30 ENCOUNTER — Other Ambulatory Visit (INDEPENDENT_AMBULATORY_CARE_PROVIDER_SITE_OTHER): Payer: BC Managed Care – PPO

## 2013-08-30 DIAGNOSIS — E785 Hyperlipidemia, unspecified: Secondary | ICD-10-CM

## 2013-08-30 LAB — HEPATIC FUNCTION PANEL
ALT: 41 U/L (ref 0–53)
AST: 46 U/L — ABNORMAL HIGH (ref 0–37)
Albumin: 4.4 g/dL (ref 3.5–5.2)
Alkaline Phosphatase: 61 U/L (ref 39–117)
Bilirubin, Direct: 0.3 mg/dL (ref 0.0–0.3)
Total Bilirubin: 1.2 mg/dL (ref 0.3–1.2)
Total Protein: 7.5 g/dL (ref 6.0–8.3)

## 2013-08-30 LAB — LIPID PANEL
Cholesterol: 151 mg/dL (ref 0–200)
HDL: 49.8 mg/dL (ref >39.00)
LDL Cholesterol: 84 mg/dL (ref 0–99)
Total CHOL/HDL Ratio: 3
Triglycerides: 88 mg/dL (ref 0.0–149.0)
VLDL: 17.6 mg/dL (ref 0.0–40.0)

## 2013-09-22 ENCOUNTER — Ambulatory Visit (INDEPENDENT_AMBULATORY_CARE_PROVIDER_SITE_OTHER): Payer: BC Managed Care – PPO | Admitting: Internal Medicine

## 2013-09-22 ENCOUNTER — Encounter: Payer: Self-pay | Admitting: Internal Medicine

## 2013-09-22 VITALS — BP 124/78 | HR 63 | Temp 97.5°F | Resp 16 | Ht 72.0 in | Wt 194.6 lb

## 2013-09-22 DIAGNOSIS — M7710 Lateral epicondylitis, unspecified elbow: Secondary | ICD-10-CM

## 2013-09-22 DIAGNOSIS — M7711 Lateral epicondylitis, right elbow: Secondary | ICD-10-CM | POA: Insufficient documentation

## 2013-09-22 MED ORDER — DICLOFENAC SODIUM 2 % TD SOLN: 2.0000 | Freq: Two times a day (BID) | TRANSDERMAL | Status: DC

## 2013-09-22 NOTE — Patient Instructions (Signed)
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 Although the condition is often called tennis or golfer's elbow, it is caused by any repetitive action performed by your elbow.  HOME CARE INSTRUCTIONS   If the condition has been short lived, rest may be the only treatment required. Using your opposite hand or arm to perform the task may help. Even changing your grip may help rest the extremity. These may even prevent the condition from recurring.   Longer standing problems, however, will often be relieved faster by:   Using anti-inflammatory agents.   Applying ice packs for 30 minutes at the end of the working day, at bed time, or when activities are finished.   Your caregiver may also have you wear a splint or sling. This will allow the inflamed tendon to heal.  At times, steroid injections aided with a local anesthetic will be required along with splinting for 1 to 2 weeks. Two to three steroid injections will often solve the problem. In some long standing cases, the inflamed tendon does not respond to conservative (non-surgical) therapy. Then surgery may be required to repair it.  MAKE SURE YOU:    Understand these instructions.   Will watch your condition.   Will get help right away if you are not doing well or get worse.  Document Released: 06/29/2005 Document Revised: 09/21/2011 Document Reviewed: 02/15/2008  ExitCare Patient Information 2014 [employer].

## 2013-09-22 NOTE — Progress Notes (Signed)
Pre visit review using our clinic review tool, if applicable. No additional management support is needed unless otherwise documented below in the visit note. 

## 2013-09-22 NOTE — Progress Notes (Signed)
   Subjective:    Patient ID: Gary Andrews, male    DOB: July 23, 1955, 58 y.o.   MRN: [id number]  Arthritis Presents for initial visit. The disease course has been fluctuating. The condition has lasted for 2 months. He complains of pain. He reports no stiffness, joint swelling or joint warmth. Affected locations include the right elbow. His pain is at a severity of 2/10. Pertinent negatives include no diarrhea, dry eyes, dry mouth, dysuria, fatigue, fever, pain at night, pain while resting, rash, Raynaud's syndrome, uveitis or weight loss. Past treatments include nothing. The treatment provided no relief. Factors aggravating his arthritis include activity and gripping.      Review of Systems  Constitutional: Negative for fever, weight loss and fatigue.  Gastrointestinal: Negative for diarrhea.  Genitourinary: Negative for dysuria.  Musculoskeletal: Positive for arthritis. Negative for joint swelling and stiffness.  Skin: Negative for rash.  All other systems reviewed and are negative.       Objective:   Physical Exam  Musculoskeletal:       Right elbow: He exhibits normal range of motion, no swelling, no effusion, no deformity and no laceration. Tenderness found. Lateral epicondyle tenderness noted. No radial head, no medial epicondyle and no olecranon process tenderness noted.     Lab Results  Component Value Date   WBC 4.0* 07/22/2012   HGB 14.9 07/22/2012   HCT 44.2 07/22/2012   PLT 185.0 07/22/2012   GLUCOSE 86 07/26/2013   CHOL 151 08/30/2013   TRIG 88.0 08/30/2013   HDL 49.80 08/30/2013   LDLDIRECT 167.3 07/26/2013   LDLCALC 84 08/30/2013   ALT 41 08/30/2013   AST 46* 08/30/2013   NA 142 07/26/2013   K 4.4 07/26/2013   CL 108 07/26/2013   CREATININE 0.8 07/26/2013   BUN 17 07/26/2013   CO2 30 07/26/2013   TSH 1.73 07/22/2012   PSA 0.90 07/22/2012   INR 1.16 05/14/2012   HGBA1C 5.6 07/22/2012       Assessment & Plan:

## 2013-09-25 NOTE — Assessment & Plan Note (Signed)
Will treat with rest.ice.topical nsaids He did not want a steroid injection today

## 2013-10-26 ENCOUNTER — Other Ambulatory Visit: Payer: Self-pay | Admitting: Internal Medicine

## 2013-10-26 DIAGNOSIS — M7711 Lateral epicondylitis, right elbow: Secondary | ICD-10-CM

## 2013-12-26 ENCOUNTER — Other Ambulatory Visit: Payer: Self-pay | Admitting: *Deleted

## 2013-12-26 MED ORDER — LISINOPRIL 2.5 MG PO TABS: ORAL_TABLET | ORAL | Status: DC

## 2013-12-26 MED ORDER — PRASUGREL HCL 10 MG PO TABS: ORAL_TABLET | ORAL | Status: DC

## 2014-03-13 ENCOUNTER — Other Ambulatory Visit: Payer: Self-pay | Admitting: *Deleted

## 2014-03-13 MED ORDER — PRASUGREL HCL 10 MG PO TABS: ORAL_TABLET | ORAL | Status: DC

## 2014-03-13 MED ORDER — CARVEDILOL 3.125 MG PO TABS: ORAL_TABLET | ORAL | Status: DC

## 2014-03-13 MED ORDER — LISINOPRIL 2.5 MG PO TABS: ORAL_TABLET | ORAL | Status: DC

## 2014-05-29 ENCOUNTER — Other Ambulatory Visit: Payer: Self-pay

## 2014-05-29 MED ORDER — LISINOPRIL 2.5 MG PO TABS: ORAL_TABLET | ORAL | Status: DC

## 2014-05-29 MED ORDER — CARVEDILOL 3.125 MG PO TABS: ORAL_TABLET | ORAL | Status: DC

## 2014-05-29 MED ORDER — PRASUGREL HCL 10 MG PO TABS: ORAL_TABLET | ORAL | Status: DC

## 2014-06-21 ENCOUNTER — Encounter (HOSPITAL_COMMUNITY): Payer: Self-pay | Admitting: Cardiovascular Disease

## 2014-08-08 ENCOUNTER — Ambulatory Visit (INDEPENDENT_AMBULATORY_CARE_PROVIDER_SITE_OTHER): Payer: PRIVATE HEALTH INSURANCE | Admitting: Nurse Practitioner

## 2014-08-08 ENCOUNTER — Encounter: Payer: Self-pay | Admitting: Nurse Practitioner

## 2014-08-08 VITALS — BP 115/80 | HR 58 | Ht 72.0 in | Wt 192.0 lb

## 2014-08-08 DIAGNOSIS — I10 Essential (primary) hypertension: Secondary | ICD-10-CM

## 2014-08-08 DIAGNOSIS — E785 Hyperlipidemia, unspecified: Secondary | ICD-10-CM

## 2014-08-08 DIAGNOSIS — I2510 Atherosclerotic heart disease of native coronary artery without angina pectoris: Secondary | ICD-10-CM

## 2014-08-08 LAB — LIPID PANEL
Cholesterol: 187 mg/dL (ref 0–200)
HDL: 43.1 mg/dL (ref >39.00)
LDL Cholesterol: 121 mg/dL — ABNORMAL HIGH (ref 0–99)
NonHDL: 143.9
Total CHOL/HDL Ratio: 4
Triglycerides: 117 mg/dL (ref 0.0–149.0)
VLDL: 23.4 mg/dL (ref 0.0–40.0)

## 2014-08-08 LAB — HEPATIC FUNCTION PANEL
ALT: 28 U/L (ref 0–53)
AST: 27 U/L (ref 0–37)
Albumin: 4.2 g/dL (ref 3.5–5.2)
Alkaline Phosphatase: 61 U/L (ref 39–117)
Bilirubin, Direct: 0.1 mg/dL (ref 0.0–0.3)
Total Bilirubin: 0.5 mg/dL (ref 0.2–1.2)
Total Protein: 6.9 g/dL (ref 6.0–8.3)

## 2014-08-08 LAB — BASIC METABOLIC PANEL
BUN: 23 mg/dL (ref 6–23)
CO2: 27 mEq/L (ref 19–32)
Calcium: 9.4 mg/dL (ref 8.4–10.5)
Chloride: 109 mEq/L (ref 96–112)
Creatinine, Ser: 0.85 mg/dL (ref 0.40–1.50)
GFR: 98.79 mL/min (ref >60.00)
Glucose, Bld: 90 mg/dL (ref 70–99)
Potassium: 4.3 mEq/L (ref 3.5–5.1)
Sodium: 142 mEq/L (ref 135–145)

## 2014-08-08 MED ORDER — PRASUGREL HCL 10 MG PO TABS: ORAL_TABLET | ORAL | Status: DC

## 2014-08-08 MED ORDER — CARVEDILOL 3.125 MG PO TABS: ORAL_TABLET | ORAL | Status: DC

## 2014-08-08 MED ORDER — NITROGLYCERIN 0.4 MG SL SUBL: 0.4000 mg | SUBLINGUAL_TABLET | SUBLINGUAL | Status: DC | PRN

## 2014-08-08 MED ORDER — ROSUVASTATIN CALCIUM 20 MG PO TABS: 20.0000 mg | ORAL_TABLET | Freq: Every day | ORAL | Status: DC

## 2014-08-08 MED ORDER — LISINOPRIL 2.5 MG PO TABS: ORAL_TABLET | ORAL | Status: DC

## 2014-08-08 NOTE — Patient Instructions (Signed)
We will be checking the following labs today BMET, Lipids, HPF  We will arrange for a stress Myoview  I have refilled your medicines today.   See Dr.Nahser in one year  Call the [place] office at [phone] if you have any questions, problems or concerns.
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
## 2014-08-13 ENCOUNTER — Telehealth: Payer: Self-pay | Admitting: Nurse Practitioner

## 2014-08-13 ENCOUNTER — Telehealth: Payer: Self-pay

## 2014-08-13 NOTE — Telephone Encounter (Signed)
New Message  Pt called for results. Please call

## 2014-08-13 NOTE — Telephone Encounter (Signed)
Called patient about his lab results. Per Truitt Merle NP, labs are satisfactory but his lipids are not as good. Patient states that he tries to take his Crestor daily, and that sometimes he forgets or he is out of town without his pills. Patient asked about what kind of foods to avoid to help. Advised him to say away from foods high in fat, high in cholesterol, sugars, and "white" carbohydrates. Patient also wanted to have his refills at a 90 day supply, in looking at chart, medications have been ordered as such. Patient verbalized understanding.   Notes Recorded by Burtis Junes, NP on 08/08/2014 at 4:17 PM Ok to report. Labs are satisfactory but his lipids are not as good.  Please verify that he is actually taking his Crestor.

## 2014-08-13 NOTE — Telephone Encounter (Signed)
-----   Message from Burtis Junes, NP sent at 08/13/2014  3:27 PM EST ----- Recommended to take Crestor daily.  Burtis Junes, RN, [facility] [address] [place], [place]  [address] [phone]  ----- Message -----    From: Newt Minion, RN    Sent: 08/13/2014   3:22 PM      To: Burtis Junes, NP  Pt spoke with Triage, RN regarding results.  Called patient about his lab results. Per Truitt Merle NP, labs are satisfactory but his lipids are not as good. Patient states that he tries to take his Crestor daily, and that sometimes he forgets or he is out of town without his pills. Patient asked about what kind of foods to avoid to help. Advised him to say away from foods high in fat, high in cholesterol, sugars, and "white" carbohydrates. Patient also wanted to have his refills at a 90 day supply, in looking at chart, medications have been ordered as such. Patient verbalized understanding.

## 2014-08-13 NOTE — Telephone Encounter (Signed)
Pt works out of town and runs out of the medications so does not take daily as recommended. Spoke with pt pharmacy ([facility]) and they can only refill 30 at a time due to insurance restrictions.  Left message for pt to contact insurance company to verify how he can received 90 supply at one time and not have to go without his medications.

## 2014-09-14 ENCOUNTER — Ambulatory Visit (HOSPITAL_COMMUNITY): Payer: No Typology Code available for payment source | Attending: Cardiology | Admitting: Radiology

## 2014-09-14 DIAGNOSIS — Z48812 Encounter for surgical aftercare following surgery on the circulatory system: Secondary | ICD-10-CM | POA: Insufficient documentation

## 2014-09-14 DIAGNOSIS — I10 Essential (primary) hypertension: Secondary | ICD-10-CM | POA: Insufficient documentation

## 2014-09-14 DIAGNOSIS — I4891 Unspecified atrial fibrillation: Secondary | ICD-10-CM | POA: Insufficient documentation

## 2014-09-14 DIAGNOSIS — E785 Hyperlipidemia, unspecified: Secondary | ICD-10-CM

## 2014-09-14 DIAGNOSIS — I2510 Atherosclerotic heart disease of native coronary artery without angina pectoris: Secondary | ICD-10-CM

## 2014-09-14 MED ORDER — TECHNETIUM TC 99M SESTAMIBI GENERIC - CARDIOLITE
33.0000 | Freq: Once | INTRAVENOUS | Status: AC | PRN
Start: 2014-09-14 — End: 2014-09-14
  Administered 2014-09-14: 33 via INTRAVENOUS

## 2014-09-14 MED ORDER — TECHNETIUM TC 99M SESTAMIBI GENERIC - CARDIOLITE
11.0000 | Freq: Once | INTRAVENOUS | Status: AC | PRN
  Administered 2014-09-14: 11 via INTRAVENOUS

## 2014-09-14 NOTE — Progress Notes (Signed)
[facility] [address] [place],  [address] [phone]    Cardiology Nuclear Med Study  Gary Andrews is a 59 y.o. male     MRN : [id number]     DOB: [birth year]  Procedure Date: 09/14/2014  Nuclear Med Background Indication for Stress Test:  Evaluation for Ischemia and Stent Patency History:  CAD, MPI ~5 yrs ago, Afib Cardiac Risk Factors: Hypertension  Symptoms:  None indicated   Nuclear Pre-Procedure Caffeine/Decaff Intake:  None> 12 hrs NPO After: 8:00pm   Lungs:  clear O2 Sat: 96% on room air. IV 0.9% NS with Angio Cath:  22g  IV Site: R Hand x 1, tolerated well IV Started by:  Irven Baltimore, RN  Chest Size (in):  40 Cup Size: n/a  Height: 6' (1.829 m)  Weight:  187 lb (84.823 kg)  BMI:  Body mass index is 25.36 kg/(m^2). Tech Comments:  Patient held Coreg x 24 hrs. Irven Baltimore, RN.    Nuclear Med Study 1 or 2 day study: 1 day  Stress Test Type:  Stress  Reading MD: N/A  Order Authorizing Provider:  Mertie Moores, MD  Resting Radionuclide: Technetium 24m Sestamibi  Resting Radionuclide Dose: 11.0 mCi   Stress Radionuclide:  Technetium 70m Sestamibi  Stress Radionuclide Dose: 33.0 mCi           Stress Protocol Rest HR: 49 Stress HR: 164  Rest BP: 99/65 Stress BP: 129/76  Exercise Time (min): 13:00 METS: 15.3   Predicted Max HR: 164 bpm % Max HR: 100 bpm Rate Pressure Product: 21156   Dose of Adenosine (mg):  n/a Dose of Lexiscan: n/a mg  Dose of Atropine (mg): n/a Dose of Dobutamine: n/a mcg/kg/min (at max HR)  Stress Test Technologist: Glade Lloyd, BS-ES  Nuclear Technologist:  Earl Many, CNMT     Rest Procedure:  Myocardial perfusion imaging was performed at rest 45 minutes following the intravenous administration of Technetium 5m Sestamibi. Rest ECG: NSR - Normal EKG  Stress Procedure:  The patient exercised on the treadmill utilizing the Bruce Protocol for 13:00 minutes. The patient stopped due to fatigue and  denied any chest pain.  Technetium 36m Sestamibi was injected at peak exercise and myocardial perfusion imaging was performed after a brief delay. Stress ECG: No significant ST segment change suggestive of ischemia.  QPS Raw Data Images:  Normal; no motion artifact; normal heart/lung ratio. Stress Images:  Normal homogeneous uptake in all areas of the myocardium. Rest Images:  Normal homogeneous uptake in all areas of the myocardium. Subtraction (SDS):  No evidence of ischemia. Transient Ischemic Dilatation (Normal <1.22):  0.98 Lung/Heart Ratio (Normal <0.45):  0.35  Quantitative Gated Spect Images QGS EDV:  123 ml QGS ESV:  63 ml  Impression Exercise Capacity:  Excellent exercise capacity. BP Response:  Normal blood pressure response. Clinical Symptoms:  No significant symptoms noted. ECG Impression:  No significant ST segment change suggestive of ischemia. Comparison with Prior Nuclear Study: No images to compare  Overall Impression:  Normal stress nuclear study.  LV Ejection Fraction: 49%.  LV Wall Motion:  Normal Wall Motion  Darlin Coco MD

## 2014-11-05 ENCOUNTER — Telehealth: Payer: Self-pay | Admitting: Cardiovascular Disease

## 2014-11-05 NOTE — Telephone Encounter (Signed)
Spoke with patient who c/o dull ache right side chest pain over the past 2-3 weeks; shoots through to the back; feels like he has been punched.  Patient states pain is not worse with activity, including bicycling many miles per week.  He is taking tylenol or ibuprofen with improvement; denies SOB, n/v,diaphoresis; no recent respiratory illness, no coughing.  Patient has hx of cardiac arrest/LAD occlusion 11/13 and reports this pain does not feel like previous symptoms prior to this event.  Patient denies pain is worse with twisting, movement of chest.  He reports he has not taken NTG and that pain is not unbearable.  I advised patient that he can take NTG if pain returns to see if there is improvement and patient states pain is constant, that he can feel it presently.  I asked if patient has any hx of problems with acid reflux or gall bladder issues - patient denies.  Patient has an appointment with Dr. Acie Fredrickson for Wed. 4/27 and wants to know what tests he might want.  I advised that I will route message to Dr. Acie Fredrickson who is working in the hospital today and will call pt back with his advice.  Patient verbalized understanding and agreement.

## 2014-11-05 NOTE — Telephone Encounter (Signed)
I mis-read the note. Pain is not related to turning , twising. He has an apt in 2 days. Pain is not associated with exertion.  He may try NTG to see if it helps.

## 2014-11-05 NOTE — Telephone Encounter (Signed)
Spoke with patient and reviewed Dr. Elmarie Shiley advice.  Patient states he is currently out of town and will try NTG on Tuesday when he returns home.  I advised patient to call back with questions or concerns prior to appointment on Wednesday.  Patient verbalized understanding and agreement.

## 2014-11-05 NOTE — Telephone Encounter (Signed)
My Gary Andrews was normal in March. I'm not sure where the pain is coming from but the heart appears stable. I will see him as scheduled in several days.   Thanks

## 2014-11-05 NOTE — Telephone Encounter (Signed)
Pt c/o of Chest Pain: 1. Are you having CP right now? Yes. On his right side  2. Are you experiencing any other symptoms (ex. SOB, nausea, vomiting, sweating)? No  3. How long have you been experiencing CP? For the past weeks (3 weeks) feels like a knife is going in one side and going all the way through the back  4. Is your CP continuous or coming and going? Coming and going. When he takes tylenol it goes away.  5. Have you taken Nitroglycerin? No  Made appt with Dr. Acie Fredrickson for 11/07/2014 @ 10:15

## 2014-11-05 NOTE — Telephone Encounter (Signed)
The pain sounds like musculoskelatal pain ( worse with movement, twisting )  I would also have him try motrin or Aleve I will follow up with him in several days

## 2014-11-07 ENCOUNTER — Encounter: Payer: Self-pay | Admitting: Cardiovascular Disease

## 2014-11-07 ENCOUNTER — Ambulatory Visit (INDEPENDENT_AMBULATORY_CARE_PROVIDER_SITE_OTHER): Payer: PRIVATE HEALTH INSURANCE | Admitting: Cardiovascular Disease

## 2014-11-07 VITALS — BP 108/84 | HR 54 | Ht 72.0 in | Wt 188.6 lb

## 2014-11-07 DIAGNOSIS — Z9861 Coronary angioplasty status: Secondary | ICD-10-CM

## 2014-11-07 DIAGNOSIS — E785 Hyperlipidemia, unspecified: Secondary | ICD-10-CM

## 2014-11-07 DIAGNOSIS — I2510 Atherosclerotic heart disease of native coronary artery without angina pectoris: Secondary | ICD-10-CM

## 2014-11-07 DIAGNOSIS — R0789 Other chest pain: Secondary | ICD-10-CM
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
## 2014-11-07 NOTE — Patient Instructions (Signed)
Medication Instructions:  TAKE Motrin 400 mg three times per day for 1 week - call Christen Bame, RN [phone] to report   Labwork: Your physician recommends that you return for lab work (cholesterol, liver, basic metabolic panel) in: 1 year on the day of or a few days before your office visit with Dr. Acie Fredrickson.  You will need to FAST for this appointment - nothing to eat or drink after midnight the night before except water.   Testing/Procedures: None  Follow-Up: Your physician wants you to follow-up in: 1 year with Dr. Acie Fredrickson unless you have worsening symptoms or no relief with Motrin  You will receive a reminder letter in the mail two months in advance. If you don't receive a letter, please call our office to schedule the follow-up appointment.

## 2015-08-13 ENCOUNTER — Other Ambulatory Visit: Payer: Self-pay | Admitting: *Deleted

## 2015-08-13 DIAGNOSIS — E785 Hyperlipidemia, unspecified: Secondary | ICD-10-CM

## 2015-08-13 MED ORDER — ROSUVASTATIN CALCIUM 20 MG PO TABS: 20.0000 mg | ORAL_TABLET | Freq: Every day | ORAL | Status: DC

## 2015-08-13 MED ORDER — CARVEDILOL 3.125 MG PO TABS: ORAL_TABLET | ORAL | Status: DC

## 2015-08-13 MED ORDER — LISINOPRIL 2.5 MG PO TABS: ORAL_TABLET | ORAL | Status: DC

## 2015-08-16 ENCOUNTER — Other Ambulatory Visit: Payer: Self-pay | Admitting: Nurse Practitioner

## 2015-11-26 ENCOUNTER — Other Ambulatory Visit: Payer: Self-pay

## 2015-11-26 DIAGNOSIS — E785 Hyperlipidemia, unspecified: Secondary | ICD-10-CM

## 2015-11-26 MED ORDER — ROSUVASTATIN CALCIUM 20 MG PO TABS: 20.0000 mg | ORAL_TABLET | Freq: Every day | ORAL | Status: DC

## 2015-11-26 MED ORDER — CARVEDILOL 3.125 MG PO TABS: ORAL_TABLET | ORAL | Status: DC

## 2015-11-26 MED ORDER — LISINOPRIL 2.5 MG PO TABS: ORAL_TABLET | ORAL | Status: DC

## 2016-03-02 ENCOUNTER — Other Ambulatory Visit: Payer: Self-pay | Admitting: Cardiovascular Disease

## 2016-03-02 ENCOUNTER — Other Ambulatory Visit: Payer: Self-pay | Admitting: Nurse Practitioner

## 2016-03-02 DIAGNOSIS — E785 Hyperlipidemia, unspecified: Secondary | ICD-10-CM

## 2016-04-02 ENCOUNTER — Telehealth: Payer: Self-pay | Admitting: Nurse Practitioner

## 2016-04-02 ENCOUNTER — Encounter: Payer: Self-pay | Admitting: Cardiovascular Disease

## 2016-04-02 ENCOUNTER — Ambulatory Visit (INDEPENDENT_AMBULATORY_CARE_PROVIDER_SITE_OTHER): Payer: PRIVATE HEALTH INSURANCE | Admitting: Cardiovascular Disease

## 2016-04-02 VITALS — BP 122/80 | HR 59 | Ht 72.0 in | Wt 188.0 lb

## 2016-04-02 DIAGNOSIS — E785 Hyperlipidemia, unspecified: Secondary | ICD-10-CM

## 2016-04-02 DIAGNOSIS — Z9861 Coronary angioplasty status: Secondary | ICD-10-CM

## 2016-04-02 DIAGNOSIS — I2510 Atherosclerotic heart disease of native coronary artery without angina pectoris: Secondary | ICD-10-CM | POA: Diagnosis not present

## 2016-04-02 LAB — COMPREHENSIVE METABOLIC PANEL
ALT: 20 U/L (ref 9–46)
AST: 24 U/L (ref 10–35)
Albumin: 4.4 g/dL (ref 3.6–5.1)
Alkaline Phosphatase: 56 U/L (ref 40–115)
BUN: 17 mg/dL (ref 7–25)
CO2: 26 mmol/L (ref 20–31)
Calcium: 9.2 mg/dL (ref 8.6–10.3)
Chloride: 107 mmol/L (ref 98–110)
Creat: 0.98 mg/dL (ref 0.70–1.33)
Glucose, Bld: 92 mg/dL (ref 65–99)
Potassium: 4.7 mmol/L (ref 3.5–5.3)
Sodium: 143 mmol/L (ref 135–146)
Total Bilirubin: 0.4 mg/dL (ref 0.2–1.2)
Total Protein: 6.6 g/dL (ref 6.1–8.1)

## 2016-04-02 LAB — LIPID PANEL
Cholesterol: 166 mg/dL (ref 125–200)
HDL: 46 mg/dL (ref >=40)
LDL Cholesterol: 107 mg/dL (ref <130)
Total CHOL/HDL Ratio: 3.6 Ratio (ref <=5.0)
Triglycerides: 65 mg/dL (ref <150)
VLDL: 13 mg/dL (ref <30)

## 2016-04-02 MED ORDER — EZETIMIBE 10 MG PO TABS: 10.0000 mg | ORAL_TABLET | Freq: Every day | ORAL | 11 refills | Status: DC

## 2016-04-02 NOTE — Patient Instructions (Signed)
Medication Instructions:  Your physician recommends that you continue on your current medications as directed. Please refer to the Current Medication list given to you today.   Labwork: TODAY - complete metabolic panel   Testing/Procedures: None Ordered   Follow-Up: Your physician wants you to follow-up in: 1 year with Dr. Nahser.  You will receive a reminder letter in the mail two months in advance. If you don't receive a letter, please call our office to schedule the follow-up appointment.   If you need a refill on your cardiac medications before your next appointment, please call your pharmacy.   Thank you for choosing [facility]! Zariah Jost, RN [phone]    

## 2016-04-02 NOTE — Telephone Encounter (Signed)
-----   Message from Thayer Headings, MD sent at 04/02/2016  5:09 PM EDT ----- The LDL is better. Still a bit higher than we would like - goal of 70 Lets add zetia 10 mg a day and reckeck labs in 3 months

## 2016-04-02 NOTE — Telephone Encounter (Signed)
Lab results and plan of care sent through [place].  Lab appointment scheduled for 12/21. I also left patient a voice mail to make him aware of changes.
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
## 2016-05-27 ENCOUNTER — Other Ambulatory Visit: Payer: Self-pay | Admitting: Nurse Practitioner

## 2016-07-02 ENCOUNTER — Other Ambulatory Visit: Payer: PRIVATE HEALTH INSURANCE | Admitting: *Deleted

## 2016-07-02 DIAGNOSIS — E785 Hyperlipidemia, unspecified: Secondary | ICD-10-CM

## 2016-07-02 LAB — COMPREHENSIVE METABOLIC PANEL
ALT: 43 U/L (ref 9–46)
AST: 44 U/L — ABNORMAL HIGH (ref 10–35)
Albumin: 4.4 g/dL (ref 3.6–5.1)
Alkaline Phosphatase: 70 U/L (ref 40–115)
BUN: 18 mg/dL (ref 7–25)
CO2: 29 mmol/L (ref 20–31)
Calcium: 9.3 mg/dL (ref 8.6–10.3)
Chloride: 105 mmol/L (ref 98–110)
Creat: 0.88 mg/dL (ref 0.70–1.33)
Glucose, Bld: 96 mg/dL (ref 65–99)
Potassium: 4.7 mmol/L (ref 3.5–5.3)
Sodium: 139 mmol/L (ref 135–146)
Total Bilirubin: 0.6 mg/dL (ref 0.2–1.2)
Total Protein: 6.6 g/dL (ref 6.1–8.1)

## 2016-07-02 LAB — LIPID PANEL
Cholesterol: 131 mg/dL (ref <200)
HDL: 41 mg/dL (ref >40)
LDL Cholesterol: 62 mg/dL (ref <100)
Total CHOL/HDL Ratio: 3.2 Ratio (ref <5.0)
Triglycerides: 141 mg/dL (ref <150)
VLDL: 28 mg/dL (ref <30)

## 2016-12-13 ENCOUNTER — Other Ambulatory Visit: Payer: Self-pay | Admitting: Cardiovascular Disease

## 2017-05-01 ENCOUNTER — Other Ambulatory Visit: Payer: Self-pay | Admitting: Cardiovascular Disease

## 2017-05-01 DIAGNOSIS — E785 Hyperlipidemia, unspecified: Secondary | ICD-10-CM

## 2017-08-16 ENCOUNTER — Other Ambulatory Visit: Payer: Self-pay | Admitting: Cardiovascular Disease

## 2017-08-16 ENCOUNTER — Telehealth: Payer: Self-pay | Admitting: Cardiology

## 2017-08-16 DIAGNOSIS — E785 Hyperlipidemia, unspecified: Secondary | ICD-10-CM

## 2017-08-16 MED ORDER — MINOCYCLINE HCL 100 MG PO CAPS: 100.0000 mg | ORAL_CAPSULE | ORAL | 0 refills | Status: DC

## 2017-08-16 MED ORDER — CARVEDILOL 3.125 MG PO TABS: 3.1250 mg | ORAL_TABLET | Freq: Two times a day (BID) | ORAL | 0 refills | Status: DC

## 2017-08-16 MED ORDER — LISINOPRIL 2.5 MG PO TABS: ORAL_TABLET | ORAL | 0 refills | Status: DC

## 2017-08-16 MED ORDER — PRASUGREL HCL 10 MG PO TABS: 10.0000 mg | ORAL_TABLET | Freq: Every day | ORAL | 0 refills | Status: DC

## 2017-08-16 MED ORDER — EZETIMIBE 10 MG PO TABS: 10.0000 mg | ORAL_TABLET | Freq: Every day | ORAL | 0 refills | Status: DC

## 2017-08-16 MED ORDER — ROSUVASTATIN CALCIUM 20 MG PO TABS: 20.0000 mg | ORAL_TABLET | Freq: Every day | ORAL | 0 refills | Status: DC

## 2017-08-16 MED ORDER — NITROGLYCERIN 0.4 MG SL SUBL: 0.4000 mg | SUBLINGUAL_TABLET | SUBLINGUAL | 0 refills | Status: AC | PRN

## 2017-08-16 NOTE — Telephone Encounter (Signed)
°*  STAT* If patient is at the pharmacy, call can be transferred to refill team.   1. Which medications need to be refilled? (please list name of each medication and dose if known)Carvedilol,Ezetimibe,Lisinopri,Minocycline,Nitroglycerin,Prasugrel and Rosuvastatin-until his appt on 08-30-17  2. Which pharmacy/location (including street and city if local pharmacy) is medication to be sent to?[facility] at [employer]  3. Do they need a 30 day or 90 day supply? Whatever he have been getting(was not sure of the amount).

## 2017-08-16 NOTE — Telephone Encounter (Signed)
Pt' medication already sent.

## 2017-08-16 NOTE — Telephone Encounter (Signed)
Pt's medication was sent to pt's pharmacy as requested. Confirmation received. Enough medication until appt on 08/30/17.

## 2017-08-16 NOTE — Telephone Encounter (Deleted)
[facility] pharmacist is calling requesting a Prior Auth on pt's medication colchicine 0.6 mg tablet. Please address

## 2017-08-30 ENCOUNTER — Telehealth: Payer: Self-pay | Admitting: Physician Assistant

## 2017-08-30 ENCOUNTER — Ambulatory Visit: Payer: 59 | Admitting: Physician Assistant

## 2017-08-30 ENCOUNTER — Encounter: Payer: Self-pay | Admitting: Physician Assistant

## 2017-08-30 VITALS — BP 118/84 | HR 51 | Ht 72.0 in | Wt 190.4 lb

## 2017-08-30 DIAGNOSIS — E785 Hyperlipidemia, unspecified: Secondary | ICD-10-CM | POA: Diagnosis not present

## 2017-08-30 DIAGNOSIS — I2510 Atherosclerotic heart disease of native coronary artery without angina pectoris: Secondary | ICD-10-CM

## 2017-08-30 DIAGNOSIS — R399 Unspecified symptoms and signs involving the genitourinary system: Secondary | ICD-10-CM | POA: Diagnosis not present

## 2017-08-30 DIAGNOSIS — I10 Essential (primary) hypertension: Secondary | ICD-10-CM | POA: Diagnosis not present

## 2017-08-30 LAB — COMPREHENSIVE METABOLIC PANEL
ALT: 39 IU/L (ref 0–44)
AST: 37 IU/L (ref 0–40)
Albumin/Globulin Ratio: 2.2 (ref 1.2–2.2)
Albumin: 4.6 g/dL (ref 3.5–5.5)
Alkaline Phosphatase: 73 IU/L (ref 39–117)
BUN/Creatinine Ratio: 16 (ref 9–20)
BUN: 15 mg/dL (ref 6–24)
Bilirubin Total: 0.5 mg/dL (ref 0.0–1.2)
CO2: 27 mmol/L (ref 20–29)
Calcium: 9.3 mg/dL (ref 8.7–10.2)
Chloride: 105 mmol/L (ref 96–106)
Creatinine, Ser: 0.95 mg/dL (ref 0.76–1.27)
GFR calc Af Amer: 101 mL/min/{1.73_m2} (ref >59)
GFR calc non Af Amer: 87 mL/min/{1.73_m2} (ref >59)
Globulin, Total: 2.1 g/dL (ref 1.5–4.5)
Glucose: 103 mg/dL — ABNORMAL HIGH (ref 65–99)
Potassium: 5.1 mmol/L (ref 3.5–5.2)
Sodium: 144 mmol/L (ref 134–144)
Total Protein: 6.7 g/dL (ref 6.0–8.5)

## 2017-08-30 LAB — LIPID PANEL
Chol/HDL Ratio: 2.2 ratio (ref 0.0–5.0)
Cholesterol, Total: 123 mg/dL (ref 100–199)
HDL: 55 mg/dL (ref >39)
LDL Calculated: 55 mg/dL (ref 0–99)
Triglycerides: 64 mg/dL (ref 0–149)
VLDL Cholesterol Cal: 13 mg/dL (ref 5–40)

## 2017-08-30 MED ORDER — CLOPIDOGREL BISULFATE 75 MG PO TABS: 75.0000 mg | ORAL_TABLET | Freq: Every day | ORAL | 3 refills | Status: DC

## 2017-08-30 MED ORDER — ROSUVASTATIN CALCIUM 20 MG PO TABS: 20.0000 mg | ORAL_TABLET | Freq: Every day | ORAL | 3 refills | Status: DC

## 2017-08-30 MED ORDER — PRASUGREL HCL 10 MG PO TABS: 10.0000 mg | ORAL_TABLET | Freq: Every day | ORAL | 3 refills | Status: DC

## 2017-08-30 MED ORDER — CLOPIDOGREL BISULFATE 75 MG PO TABS: 75.0000 mg | ORAL_TABLET | Freq: Every day | ORAL | 3 refills | Status: AC

## 2017-08-30 MED ORDER — LISINOPRIL 2.5 MG PO TABS: 2.5000 mg | ORAL_TABLET | Freq: Every day | ORAL | 3 refills | Status: DC

## 2017-08-30 MED ORDER — CARVEDILOL 3.125 MG PO TABS: 3.1250 mg | ORAL_TABLET | Freq: Two times a day (BID) | ORAL | 3 refills | Status: DC

## 2017-08-30 MED ORDER — EZETIMIBE 10 MG PO TABS: 10.0000 mg | ORAL_TABLET | Freq: Every day | ORAL | 3 refills | Status: DC

## 2017-08-30 NOTE — Telephone Encounter (Signed)
Pt has been made aware per Dr. Acie Fredrickson and Richardson Dopp,  PA pt to finish out current bottle of Effient and then change over to Plavix 75 mg daily. I have called in new Rx to [facility]. Both pt and the Pharmacists thanked me for my call today. Pt is aware to call pharmacy when he is down to his last few tablets of Effient, so that they may fill the Plavix for him. Pt verbalized understanding.

## 2017-08-30 NOTE — Progress Notes (Signed)
Cardiology Office Note:    Date:  08/30/2017   ID:  [name], DOB [birth year], MRN [id number]  PCP:  Janith Lima, MD  Cardiologist:  Mertie Moores, MD   Referring MD: No ref. provider found   Chief Complaint  Patient presents with  . Follow-up    CAD    History of Present Illness:    Gary Andrews is a 62 y.o. male with a hx of coronary artery disease status post prior stenting to the LCx in December 2010 in March 2011 in [place], [place] and an anterolateral ST elevation myocardial infarction complicated by VF arrest in November 2013.  Cardiac catheterization at that time demonstrated a subtotally occluded proximal LAD which was treated with a DES.  EF was down at 35%.  Subsequent echocardiogram has demonstrated normal LV function.  Nuclear stress test in 2016 demonstrated no ischemia.  Last seen by Dr. Liam Rogers in 03/2016.    Gary Andrews returns for follow-up.  He is here alone.  He continues to cycle.  He rides about 4 days a week.  He denies chest pain, shortness of breath, syncope, lower extremity edema.  He does have urinary hesitancy and frequency as well as nocturia.  Prior CV studies:   The following studies were reviewed today:  Nuclear stress test 09/17/14 No ischemia, Normal stress nuclear study.  LV Ejection Fraction: 49%.   Echo 07/19/12 Mild concentric LVH, EF 50-55, normal wall motion, mild MR  Echo 05/15/12 EF 50-55, inferior hypokinesis, grade 2 diastolic dysfunction, trivial AI, trivial MR, mild TR, PASP 31  Cardiac catheterization 05/14/12 LM distal 20 LAD proximal 99, mid 50-60 LCx proximal 50, stent patent, AV groove circumflex jailed by stent (50) RCA proximal 30-40 Anterolateral hypokinesis, EF 35 PCI: 2.75 x 24 mm Promus element DES proximal LAD  Past Medical History:  Diagnosis Date  . Atrial fibrillation ([place])    a. Noted at time of STEMI 05/2012, converted on amiodarone.  Marland Kitchen CAD (coronary artery disease)    a. 2012 s/p stenting x 2 (3 mos apart)  [place]. b. Acute ant-lat STEMI s/p DES to prox LAD 05/14/12, residual mod Cx dz for med rx - possible cardiac arrest (CPR).  . Cardiac arrest ([place])    a. 05/2012 - came in as bystander CPR, pulses back before EMS arrived at time of STEMI.  . Gout 2012  . HTN (hypertension)   . Hyperlipidemia   . Ischemic cardiomyopathy    a. EF 35% by cath at time of STEMI, improved to 50-55% at time of DC 05/2012.  . Transaminitis    a. At time of STEMI 05/2012 ?shock liver.    Past Surgical History:  Procedure Laterality Date  . CORONARY ANGIOPLASTY WITH STENT PLACEMENT  2013  . LEFT HEART CATHETERIZATION WITH CORONARY ANGIOGRAM N/A 05/14/2012   Procedure: LEFT HEART CATHETERIZATION WITH CORONARY ANGIOGRAM;  Surgeon: Sherren Mocha, MD;  Location: [facility] CATH LAB;  Service: Cardiovascular;  Laterality: N/A;    Current Medications: Current Meds  Medication Sig  . aspirin 81 MG tablet Take 1 tablet (81 mg total) by mouth daily.  . carvedilol (COREG) 3.125 MG tablet Take 1 tablet (3.125 mg total) by mouth 2 (two) times daily with a meal.  . colchicine 0.6 MG tablet Take 0.6 mg by mouth as needed (GOUT ATTACK).  . ezetimibe (ZETIA) 10 MG tablet Take 1 tablet (10 mg total) by mouth daily.  Marland Kitchen lisinopril (PRINIVIL,ZESTRIL) 2.5 MG tablet Take 1 tablet (2.5 mg total)  by mouth daily.  . nitroGLYCERIN (NITROSTAT) 0.4 MG SL tablet Place 1 tablet (0.4 mg total) under the tongue every 5 (five) minutes as needed for chest pain (up to 3 doses). Please keep upcoming appt.  . prasugrel (EFFIENT) 10 MG TABS tablet Take 1 tablet (10 mg total) by mouth daily.  . rosuvastatin (CRESTOR) 20 MG tablet Take 1 tablet (20 mg total) by mouth daily.     Allergies:   Patient has no known allergies.   Social History   Tobacco Use  . Smoking status: Never Smoker  . Smokeless tobacco: Never Used  Substance Use Topics  . Alcohol use: Yes    Alcohol/week: 1.8 oz    Types: 3 Cans of beer per week    Comment: occasional drink.    . Drug use: No     Family Hx: The patient's family history includes Alcohol abuse in his mother and sister; CAD in his brother and father; Heart attack in his father; Heart disease in his brother, brother, and father; Stroke in his father. There is no history of Cancer, Diabetes, Early death, Hyperlipidemia, or Hypertension.  ROS:   Please see the history of present illness.    ROS All other systems reviewed and are negative.   EKGs/Labs/Other Test Reviewed:    EKG:  EKG is  ordered today.  The ekg ordered today demonstrates sinus bradycardia, heart rate 51, normal axis, QTC 409 ms, no change from prior tracing  Recent Labs: No results found for requested labs within last 8760 hours.   Recent Lipid Panel Lab Results  Component Value Date/Time   CHOL 131 07/02/2016 08:17 AM   TRIG 141 07/02/2016 08:17 AM   HDL 41 07/02/2016 08:17 AM   CHOLHDL 3.2 07/02/2016 08:17 AM   LDLCALC 62 07/02/2016 08:17 AM   LDLDIRECT 167.3 07/26/2013 08:20 AM    Physical Exam:    VS:  BP 118/84   Pulse (!) 51   Ht 6' (1.829 m)   Wt 190 lb 6.4 oz (86.4 kg)   SpO2 98%   BMI 25.82 kg/m     Wt Readings from Last 3 Encounters:  08/30/17 190 lb 6.4 oz (86.4 kg)  04/02/16 188 lb (85.3 kg)  11/07/14 188 lb 9.6 oz (85.5 kg)     Physical Exam  Constitutional: He is oriented to person, place, and time. He appears well-developed and well-nourished. No distress.  HENT:  Head: Normocephalic and atraumatic.  Neck: No JVD present. Carotid bruit is not present.  Cardiovascular: Normal rate and regular rhythm.  No murmur heard. Pulmonary/Chest: Effort normal. He has no rales.  Abdominal: Soft.  Musculoskeletal: He exhibits no edema.  Neurological: He is alert and oriented to person, place, and time.  Skin: Skin is warm and dry.    ASSESSMENT & PLAN:    1.  Coronary artery disease History of stenting of the LCx in [facility] in 2010 and 2011 and anterolateral ST elevation myocardial infarction in  0867 complicated by VF arrest treated with DES to the LAD.  He is doing well without angina.  He continues to cycle several times a week without difficulty.  Continue aspirin, Effient, statin, beta-blocker, ACE inhibitor.  It has been almost 6 years since his myocardial infarction.  I will review further with Dr. Acie Fredrickson whether to continue Effient or switch him to Plavix.  2.  Hyperlipidemia, unspecified hyperlipidemia type  Continue statin.  - Plan: Comprehensive metabolic panel, Lipid panel  3.  Essential hypertension  The  patient's blood pressure is controlled on his current regimen.  Continue current therapy.   4.  Lower urinary tract symptoms (LUTS) He describes symptoms that sound consistent with benign prostatic hypertrophy.  I have suggested that he follow-up with his primary care physician.  Dispo:  Return in about 1 year (around 08/30/2018) for Routine Follow Up, w/ Dr. Acie Fredrickson.   Medication Adjustments/Labs and Tests Ordered: Current medicines are reviewed at length with the patient today.  Concerns regarding medicines are outlined above.  Tests Ordered: Orders Placed This Encounter  Procedures  . Comprehensive metabolic panel  . Lipid panel  . EKG 12-Lead   Medication Changes: Meds ordered this encounter  Medications  . carvedilol (COREG) 3.125 MG tablet    Sig: Take 1 tablet (3.125 mg total) by mouth 2 (two) times daily with a meal.    Dispense:  180 tablet    Refill:  3    Order Specific Question:   Supervising Provider    Answer:   Constance Haw [[id number]]  . ezetimibe (ZETIA) 10 MG tablet    Sig: Take 1 tablet (10 mg total) by mouth daily.    Dispense:  90 tablet    Refill:  3    Order Specific Question:   Supervising Provider    Answer:   Constance Haw [[id number]]  . lisinopril (PRINIVIL,ZESTRIL) 2.5 MG tablet    Sig: Take 1 tablet (2.5 mg total) by mouth daily.    Dispense:  90 tablet    Refill:  3    Order Specific Question:   Supervising Provider     Answer:   Constance Haw [[id number]]  . prasugrel (EFFIENT) 10 MG TABS tablet    Sig: Take 1 tablet (10 mg total) by mouth daily.    Dispense:  90 tablet    Refill:  3    Order Specific Question:   Supervising Provider    Answer:   Constance Haw [[id number]]  . rosuvastatin (CRESTOR) 20 MG tablet    Sig: Take 1 tablet (20 mg total) by mouth daily.    Dispense:  90 tablet    Refill:  3    Order Specific Question:   Supervising Provider    Answer:   Constance Haw [[id number]]    Signed, Richardson Dopp, PA-C  08/30/2017 10:19 AM    [facility] [address], [place], [place]  [address] Phone: [phone]; Fax: [phone]

## 2017-08-30 NOTE — Telephone Encounter (Signed)
Please notify patient that I reviewed his case with Dr. Liam Rogers.  We would like to change him to Plavix. PLAN:  1. Finish current bottle of Effient. 2. Start Plavix 75 mg Once daily once finished with Effient. Richardson Dopp, PA-C    08/30/2017 3:37 PM

## 2017-08-30 NOTE — Patient Instructions (Signed)
Medication Instructions:  1. Your physician recommends that you continue on your current medications as directed. Please refer to the Current Medication list given to you today.   Labwork: TODAY CMET, LIPID   Testing/Procedures: NONE ORDERED TODAY  Follow-Up: Your physician wants you to follow-up in: [place] Acie Fredrickson. You will receive a reminder letter in the mail two months in advance. If you don't receive a letter, please call our office to schedule the follow-up appointment.   Any Other Special Instructions Will Be Listed Below (If Applicable).     If you need a refill on your cardiac medications before your next appointment, please call your pharmacy.

## 2017-08-30 NOTE — Addendum Note (Signed)
Addended by: Michae Kava on: 08/30/2017 04:47 PM   Modules accepted: Orders

## 2017-09-06 ENCOUNTER — Ambulatory Visit: Payer: Self-pay

## 2017-09-06 NOTE — Telephone Encounter (Signed)
   Reason for Disposition . Urination is difficult to start (i.e., hesitancy) or straining  Answer Assessment - Initial Assessment Questions 1. SYMPTOM: "What's the main symptom you're concerned about?" (e.g., frequency, incontinence)     Hesitancy, difficulty getting started 2. ONSET: "When did the  ________  start?"     Started 1year 3. PAIN: "Is there any pain?" If so, ask: "How bad is it?" (Scale: 1-10; mild, moderate, severe)     No pain 4. CAUSE: "What do you think is causing the symptoms?"     Unsure 5. OTHER SYMPTOMS: "Do you have any other symptoms?" (e.g., fever, flank pain, blood in urine, pain with urination)     No 6. PREGNANCY: "Is there any chance you are pregnant?" "When was your last menstrual period?"     No  Protocols used: URINARY [facility]

## 2017-09-06 NOTE — Telephone Encounter (Signed)
Patient scheduled for Thursday

## 2017-09-09 ENCOUNTER — Other Ambulatory Visit (INDEPENDENT_AMBULATORY_CARE_PROVIDER_SITE_OTHER): Payer: 59

## 2017-09-09 ENCOUNTER — Encounter: Payer: Self-pay | Admitting: Internal Medicine

## 2017-09-09 ENCOUNTER — Ambulatory Visit: Payer: 59 | Admitting: Internal Medicine

## 2017-09-09 VITALS — BP 120/74 | HR 57 | Temp 97.8°F | Resp 16 | Ht 72.0 in | Wt 191.0 lb

## 2017-09-09 DIAGNOSIS — N401 Enlarged prostate with lower urinary tract symptoms: Secondary | ICD-10-CM | POA: Diagnosis not present

## 2017-09-09 DIAGNOSIS — N138 Other obstructive and reflux uropathy: Secondary | ICD-10-CM

## 2017-09-09 DIAGNOSIS — Z23 Encounter for immunization: Secondary | ICD-10-CM

## 2017-09-09 LAB — URINALYSIS, ROUTINE W REFLEX MICROSCOPIC
Bilirubin Urine: NEGATIVE
Hgb urine dipstick: NEGATIVE
Ketones, ur: NEGATIVE
Leukocytes, UA: NEGATIVE
Nitrite: NEGATIVE
RBC / HPF: NONE SEEN (ref 0–?)
Specific Gravity, Urine: 1.025 (ref 1.000–1.030)
Total Protein, Urine: NEGATIVE
Urine Glucose: NEGATIVE
Urobilinogen, UA: 0.2 (ref 0.0–1.0)
pH: 6 (ref 5.0–8.0)

## 2017-09-09 LAB — PSA: PSA: 2.38 ng/mL (ref 0.10–4.00)

## 2017-09-09 MED ORDER — SILODOSIN 4 MG PO CAPS: 4.0000 mg | ORAL_CAPSULE | Freq: Every day | ORAL | 1 refills | Status: DC

## 2017-09-09 NOTE — Patient Instructions (Signed)
Benign Prostatic Hyperplasia  Benign prostatic hyperplasia (BPH) is an enlarged prostate gland that is caused by the normal aging process and not by cancer. The prostate is a walnut-sized gland that is involved in the production of semen. It is located in front of the rectum and below the bladder. The bladder stores urine and the urethra is the tube that carries the urine out of the body. The prostate may get bigger as a man gets older.  An enlarged prostate can press on the urethra. This can make it harder to pass urine. The build-up of urine in the bladder can cause infection. Back pressure and infection may progress to bladder damage and kidney (renal) failure.  What are the causes?  This condition is part of a normal aging process. However, not all men develop problems from this condition. If the prostate enlarges away from the urethra, urine flow will not be blocked. If it enlarges toward the urethra and compresses it, there will be problems passing urine.  What increases the risk?  This condition is more likely to develop in men over the age of 50 years.  What are the signs or symptoms?  Symptoms of this condition include:  · Getting up often during the night to urinate.  · Needing to urinate frequently during the day.  · Difficulty starting urine flow.  · Decrease in size and strength of your urine stream.  · Leaking (dribbling) after urinating.  · Inability to pass urine. This needs immediate treatment.  · Inability to completely empty your bladder.  · Pain when you pass urine. This is more common if there is also an infection.  · Urinary tract infection (UTI).    How is this diagnosed?  This condition is diagnosed based on your medical history, a physical exam, and your symptoms. Tests will also be done, such as:  · A post-void bladder scan. This measures any amount of urine that may remain in your bladder after you finish urinating.  · A digital rectal exam. In a rectal exam, your health care provider  checks your prostate by putting a lubricated, gloved finger into your rectum to feel the back of your prostate gland. This exam detects the size of your gland and any abnormal lumps or growths.  · An exam of your urine (urinalysis).  · A prostate specific antigen (PSA) screening. This is a blood test used to screen for prostate cancer.  · An ultrasound. This test uses sound waves to electronically produce a picture of your prostate gland.    Your health care provider may refer you to a specialist in kidney and prostate diseases (urologist).  How is this treated?  Once symptoms begin, your health care provider will monitor your condition (active surveillance or watchful waiting). Treatment for this condition will depend on the severity of your condition. Treatment may include:  · Observation and yearly exams. This may be the only treatment needed if your condition and symptoms are mild.  · Medicines to relieve your symptoms, including:  ? Medicines to shrink the prostate.  ? Medicines to relax the muscle of the prostate.  · Surgery in severe cases. Surgery may include:  ? Prostatectomy. In this procedure, the prostate tissue is removed completely through an open incision or with a laparascope or robotics.  ? Transurethral resection of the prostate (TURP). In this procedure, a tool is inserted through the opening at the tip of the penis (urethra). It is used to cut away tissue of   the inner core of the prostate. The pieces are removed through the same opening of the penis. This removes the blockage.  ? Transurethral incision (TUIP). In this procedure, small cuts are made in the prostate. This lessens the prostate's pressure on the urethra.  ? Transurethral microwave thermotherapy (TUMT). This procedure uses microwaves to create heat. The heat destroys and removes a small amount of prostate tissue.  ? Transurethral needle ablation (TUNA). This procedure uses radio frequencies to destroy and remove a small amount of  prostate tissue.  ? Interstitial laser coagulation (ILC). This procedure uses a laser to destroy and remove a small amount of prostate tissue.  ? Transurethral electrovaporization (TUVP). This procedure uses electrodes to destroy and remove a small amount of prostate tissue.  ? Prostatic urethral lift. This procedure inserts an implant to push the lobes of the prostate away from the urethra.    Follow these instructions at home:  · Take over-the-counter and prescription medicines only as told by your health care provider.  · Monitor your symptoms for any changes. Contact your health care provider with any changes.  · Avoid drinking large amounts of liquid before going to bed or out in public.  · Avoid or reduce how much caffeine or alcohol you drink.  · Give yourself time when you urinate.  · Keep all follow-up visits as told by your health care provider. This is important.  Contact a health care provider if:  · You have unexplained back pain.  · Your symptoms do not get better with treatment.  · You develop side effects from the medicine you are taking.  · Your urine becomes very dark or has a bad smell.  · Your lower abdomen becomes distended and you have trouble passing your urine.  Get help right away if:  · You have a fever or chills.  · You suddenly cannot urinate.  · You feel lightheaded, or very dizzy, or you faint.  · There are large amounts of blood or clots in the urine.  · Your urinary problems become hard to manage.  · You develop moderate to severe low back or flank pain. The flank is the side of your body between the ribs and the hip.  These symptoms may represent a serious problem that is an emergency. Do not wait to see if the symptoms will go away. Get medical help right away. Call your local emergency services (911 in the [place] Do not drive yourself to the hospital.  Summary  · Benign prostatic hyperplasia (BPH) is an enlarged prostate that is caused by the normal aging process and not by  cancer.  · An enlarged prostate can press on the urethra. This can make it hard to pass urine.  · This condition is part of a normal aging process and is more likely to develop in men over the age of 50 years.  · Get help right away if you suddenly cannot urinate.  This information is not intended to replace advice given to you by your health care provider. Make sure you discuss any questions you have with your health care provider.  Document Released: 06/29/2005 Document Revised: 08/03/2016 Document Reviewed: 08/03/2016  Elsevier Interactive Patient Education © 2018 [employer]

## 2017-09-09 NOTE — Progress Notes (Signed)
Subjective:  Patient ID: Gary Andrews, male    DOB: [birth year]  Age: 62 y.o. MRN: [id number]  CC: Benign Prostatic Hypertrophy   HPI Gary Andrews presents for a several month history of difficulty urinating.  He complains that he gets the urge to urinate but then he has hesitancy and straining.  He has nocturia about 2-3 times a night.  He has had no episodes of dysuria, hematuria, pelvic pain, rash, or lymphadenopathy.  Outpatient Medications Prior to Visit  Medication Sig Dispense Refill  . aspirin 81 MG tablet Take 1 tablet (81 mg total) by mouth daily.    . carvedilol (COREG) 3.125 MG tablet Take 1 tablet (3.125 mg total) by mouth 2 (two) times daily with a meal. 180 tablet 3  . clopidogrel (PLAVIX) 75 MG tablet Take 1 tablet (75 mg total) by mouth daily. Start the day after you finish Effient 90 tablet 3  . colchicine 0.6 MG tablet Take 0.6 mg by mouth as needed (GOUT ATTACK).    . ezetimibe (ZETIA) 10 MG tablet Take 1 tablet (10 mg total) by mouth daily. 90 tablet 3  . lisinopril (PRINIVIL,ZESTRIL) 2.5 MG tablet Take 1 tablet (2.5 mg total) by mouth daily. 90 tablet 3  . nitroGLYCERIN (NITROSTAT) 0.4 MG SL tablet Place 1 tablet (0.4 mg total) under the tongue every 5 (five) minutes as needed for chest pain (up to 3 doses). Please keep upcoming appt. 25 tablet 0  . rosuvastatin (CRESTOR) 20 MG tablet Take 1 tablet (20 mg total) by mouth daily. 90 tablet 3   No facility-administered medications prior to visit.     ROS Review of Systems  Constitutional: Negative.  Negative for appetite change, diaphoresis and fever.  HENT: Negative.   Eyes: Negative.   Respiratory: Negative.  Negative for cough and shortness of breath.   Cardiovascular: Negative for chest pain and leg swelling.  Gastrointestinal: Negative.  Negative for abdominal pain, diarrhea and vomiting.  Endocrine: Negative.   Genitourinary: Positive for difficulty urinating. Negative for decreased urine volume, dysuria, flank  pain, frequency, hematuria, penile swelling, scrotal swelling, testicular pain and urgency.  Musculoskeletal: Negative.   Skin: Negative.  Negative for color change, pallor and rash.  Allergic/Immunologic: Negative.   Neurological: Negative.  Negative for dizziness, weakness and light-headedness.  Hematological: Negative for adenopathy. Does not bruise/bleed easily.  Psychiatric/Behavioral: Negative.     Objective:  BP 120/74 (BP Location: Left Arm, Patient Position: Sitting, Cuff Size: Normal)   Pulse (!) 57   Temp 97.8 F (36.6 C) (Oral)   Resp 16   Ht 6' (1.829 m)   Wt 191 lb (86.6 kg)   SpO2 97%   BMI 25.90 kg/m   BP Readings from Last 3 Encounters:  09/09/17 120/74  08/30/17 118/84  04/02/16 122/80    Wt Readings from Last 3 Encounters:  09/09/17 191 lb (86.6 kg)  08/30/17 190 lb 6.4 oz (86.4 kg)  04/02/16 188 lb (85.3 kg)    Physical Exam  Constitutional: No distress.  HENT:  Mouth/Throat: Oropharynx is clear and moist. No oropharyngeal exudate.  Eyes: Conjunctivae are normal. Left eye exhibits no discharge. No scleral icterus.  Neck: Normal range of motion. Neck supple. No JVD present. No thyromegaly present.  Cardiovascular: Normal rate, regular rhythm and normal heart sounds. Exam reveals no gallop.  No murmur heard. Pulmonary/Chest: Effort normal and breath sounds normal. No respiratory distress. He has no wheezes. He has no rales.  Abdominal: Soft. Bowel sounds are normal. He  exhibits no distension and no mass. There is no tenderness. There is no guarding. Hernia confirmed negative in the right inguinal area and confirmed negative in the left inguinal area.  Genitourinary: Testes normal and penis normal. Rectal exam shows no external hemorrhoid, no internal hemorrhoid, no fissure, no mass, no tenderness, anal tone normal and guaiac negative stool. Prostate is enlarged. Prostate is not tender. Right testis shows no mass, no swelling and no tenderness. Left testis  shows no swelling and no tenderness. Circumcised. No penile erythema or penile tenderness. No discharge found.  Genitourinary Comments: 2+, smooth, symmetrical BPH with no bogginess or tenderness.  Lymphadenopathy:    He has no cervical adenopathy.       Right: No inguinal adenopathy present.       Left: No inguinal adenopathy present.  Skin: He is not diaphoretic.  Vitals reviewed.   Lab Results  Component Value Date   WBC 4.0 (L) 07/22/2012   HGB 14.9 07/22/2012   HCT 44.2 07/22/2012   PLT 185.0 07/22/2012   GLUCOSE 103 (H) 08/30/2017   CHOL 123 08/30/2017   TRIG 64 08/30/2017   HDL 55 08/30/2017   LDLDIRECT 167.3 07/26/2013   LDLCALC 55 08/30/2017   ALT 39 08/30/2017   AST 37 08/30/2017   NA 144 08/30/2017   K 5.1 08/30/2017   CL 105 08/30/2017   CREATININE 0.95 08/30/2017   BUN 15 08/30/2017   CO2 27 08/30/2017   TSH 1.73 07/22/2012   PSA 2.38 09/09/2017   INR 1.16 05/14/2012   HGBA1C 5.6 07/22/2012    Dg Foot Complete Right  Result Date: 07/11/2013 CLINICAL DATA:  Right heel pain EXAM: RIGHT FOOT COMPLETE - 3+ VIEW COMPARISON:  None. FINDINGS: There is no evidence of fracture or dislocation. There is no evidence of arthropathy or other focal bone abnormality. Soft tissues are unremarkable. IMPRESSION: Negative. Electronically Signed   By: Kathreen Devoid   On: 07/11/2013 12:53    Assessment & Plan:   [name] was seen today for benign prostatic hypertrophy.  Diagnoses and all orders for this visit:  Need for influenza vaccination -     Flu Vaccine QUAD 36+ mos IM  BPH with urinary obstruction- His symptoms and exam are consistent with BPH.  There is no evidence of infection based on the exam and urinalysis. Will start a peripheral alpha blocker for symptom relief.  Will increase the dose of the alpha blocker and may add a 5 alpha reductase inhibitor based on his response.  His PSA has nearly tripled over the last 5 years.  He will return in about 2 months for me to  recheck his PSA and he agrees not to ejaculate for a week prior to that testing.  If his PSA remains elevated then I will refer to urology to consider screening options for prostate cancer. -     PSA; Future -     Urinalysis, Routine w reflex microscopic; Future -     silodosin (RAPAFLO) 4 MG CAPS capsule; Take 1 capsule (4 mg total) by mouth daily with breakfast.   I am having [name] start on silodosin. I am also having him maintain his aspirin, nitroGLYCERIN, colchicine, carvedilol, ezetimibe, lisinopril, rosuvastatin, and clopidogrel.  Meds ordered this encounter  Medications  . silodosin (RAPAFLO) 4 MG CAPS capsule    Sig: Take 1 capsule (4 mg total) by mouth daily with breakfast.    Dispense:  90 capsule    Refill:  1  Follow-up: Return in about 3 months (around 12/07/2017).  Scarlette Calico, MD

## 2017-09-10 ENCOUNTER — Encounter: Payer: Self-pay | Admitting: Internal Medicine

## 2017-11-11 ENCOUNTER — Other Ambulatory Visit (INDEPENDENT_AMBULATORY_CARE_PROVIDER_SITE_OTHER): Payer: 59

## 2017-11-11 ENCOUNTER — Encounter: Payer: Self-pay | Admitting: Internal Medicine

## 2017-11-11 ENCOUNTER — Ambulatory Visit (INDEPENDENT_AMBULATORY_CARE_PROVIDER_SITE_OTHER): Payer: 59 | Admitting: Internal Medicine

## 2017-11-11 VITALS — BP 112/74 | HR 59 | Temp 97.7°F | Resp 16 | Ht 72.0 in | Wt 183.0 lb

## 2017-11-11 DIAGNOSIS — N401 Enlarged prostate with lower urinary tract symptoms: Secondary | ICD-10-CM

## 2017-11-11 DIAGNOSIS — N138 Other obstructive and reflux uropathy: Secondary | ICD-10-CM

## 2017-11-11 DIAGNOSIS — M1000 Idiopathic gout, unspecified site: Secondary | ICD-10-CM

## 2017-11-11 LAB — PSA: PSA: 0.7

## 2017-11-11 LAB — URIC ACID: Uric Acid, Serum: 6.7 mg/dL (ref 4.0–7.8)

## 2017-11-11 MED ORDER — COLCHICINE 0.6 MG PO CAPS: 1.0000 | ORAL_CAPSULE | Freq: Two times a day (BID) | ORAL | 1 refills | Status: DC

## 2017-11-11 MED ORDER — ALLOPURINOL 100 MG PO TABS: 100.0000 mg | ORAL_TABLET | Freq: Every day | ORAL | 1 refills | Status: DC

## 2017-11-11 NOTE — Patient Instructions (Signed)

## 2017-11-11 NOTE — Progress Notes (Signed)
Subjective:  Patient ID: Gary Andrews, male    DOB: 1956-07-09  Age: 62 y.o. MRN: [id number]  CC: Gout and Benign Prostatic Hypertrophy   HPI Gary Andrews presents for f/up - His urinary symptoms have resolved.  He denies nocturia, urinary hesitancy, dribbling, or urgency.  He does complain since of last saw him of a few episodes of gout.  He usually has it in his left ankle but about a week or 2 ago he had an episode in his left foot in the first MTP joint.  He treated it with colchicine and says it feels much better now.  He requests a refill on colchicine.  Outpatient Medications Prior to Visit  Medication Sig Dispense Refill  . aspirin 81 MG tablet Take 1 tablet (81 mg total) by mouth daily.    . carvedilol (COREG) 3.125 MG tablet Take 1 tablet (3.125 mg total) by mouth 2 (two) times daily with a meal. 180 tablet 3  . clopidogrel (PLAVIX) 75 MG tablet Take 1 tablet (75 mg total) by mouth daily. Start the day after you finish Effient 90 tablet 3  . ezetimibe (ZETIA) 10 MG tablet Take 1 tablet (10 mg total) by mouth daily. 90 tablet 3  . lisinopril (PRINIVIL,ZESTRIL) 2.5 MG tablet Take 1 tablet (2.5 mg total) by mouth daily. 90 tablet 3  . rosuvastatin (CRESTOR) 20 MG tablet Take 1 tablet (20 mg total) by mouth daily. 90 tablet 3  . silodosin (RAPAFLO) 4 MG CAPS capsule Take 1 capsule (4 mg total) by mouth daily with breakfast. 90 capsule 1  . colchicine 0.6 MG tablet Take 0.6 mg by mouth as needed (GOUT ATTACK).    . nitroGLYCERIN (NITROSTAT) 0.4 MG SL tablet Place 1 tablet (0.4 mg total) under the tongue every 5 (five) minutes as needed for chest pain (up to 3 doses). Please keep upcoming appt. (Patient not taking: Reported on 11/11/2017) 25 tablet 0   No facility-administered medications prior to visit.     ROS Review of Systems  Constitutional: Negative for chills, fatigue and fever.  HENT: Negative.   Eyes: Negative for visual disturbance.  Respiratory: Negative for cough, chest  tightness, shortness of breath and wheezing.   Cardiovascular: Negative for chest pain, palpitations and leg swelling.  Gastrointestinal: Negative.  Negative for abdominal pain, diarrhea, nausea and vomiting.  Endocrine: Negative.   Genitourinary: Negative.  Negative for decreased urine volume, difficulty urinating, dysuria, hematuria and urgency.  Musculoskeletal: Negative.  Negative for arthralgias.  Skin: Negative.  Negative for color change.  Allergic/Immunologic: Negative.   Neurological: Negative.  Negative for dizziness, weakness and light-headedness.  Hematological: Negative for adenopathy. Does not bruise/bleed easily.  Psychiatric/Behavioral: Negative.     Objective:  BP 112/74 (BP Location: Left Arm, Patient Position: Sitting, Cuff Size: Normal)   Pulse (!) 59   Temp 97.7 F (36.5 C) (Oral)   Resp 16   Ht 6' (1.829 m)   Wt 183 lb (83 kg)   SpO2 96%   BMI 24.82 kg/m   BP Readings from Last 3 Encounters:  11/11/17 112/74  09/09/17 120/74  08/30/17 118/84    Wt Readings from Last 3 Encounters:  11/11/17 183 lb (83 kg)  09/09/17 191 lb (86.6 kg)  08/30/17 190 lb 6.4 oz (86.4 kg)    Physical Exam  Constitutional: He does not have a sickly appearance. He does not appear ill. No distress.  HENT:  Mouth/Throat: Oropharynx is clear and moist. No oropharyngeal exudate.  Eyes: Conjunctivae  are normal.  Neck: Normal range of motion. Neck supple.  Cardiovascular: Normal rate, regular rhythm and normal heart sounds. Exam reveals no gallop and no friction rub.  No murmur heard. Pulmonary/Chest: Effort normal and breath sounds normal. No stridor. No respiratory distress. He has no wheezes. He has no rales.  Abdominal: Soft. Bowel sounds are normal. He exhibits no distension and no mass.  Musculoskeletal: Normal range of motion. He exhibits no edema, tenderness or deformity.       Left ankle: Normal. He exhibits no swelling and no deformity. No tenderness.       Left foot:  Normal. There is no tenderness, no bony tenderness and no swelling.  Skin: Skin is warm and dry.  Vitals reviewed.   Lab Results  Component Value Date   WBC 4.0 (L) 07/22/2012   HGB 14.9 07/22/2012   HCT 44.2 07/22/2012   PLT 185.0 07/22/2012   GLUCOSE 103 (H) 08/30/2017   CHOL 123 08/30/2017   TRIG 64 08/30/2017   HDL 55 08/30/2017   LDLDIRECT 167.3 07/26/2013   LDLCALC 55 08/30/2017   ALT 39 08/30/2017   AST 37 08/30/2017   NA 144 08/30/2017   K 5.1 08/30/2017   CL 105 08/30/2017   CREATININE 0.95 08/30/2017   BUN 15 08/30/2017   CO2 27 08/30/2017   TSH 1.73 07/22/2012   PSA 0.7 11/11/2017   INR 1.16 05/14/2012   HGBA1C 5.6 07/22/2012    Dg Foot Complete Right  Result Date: 07/11/2013 CLINICAL DATA:  Right heel pain EXAM: RIGHT FOOT COMPLETE - 3+ VIEW COMPARISON:  None. FINDINGS: There is no evidence of fracture or dislocation. There is no evidence of arthropathy or other focal bone abnormality. Soft tissues are unremarkable. IMPRESSION: Negative. Electronically Signed   By: Kathreen Devoid   On: 07/11/2013 12:53    Assessment & Plan:   Gary Andrews was seen today for gout and benign prostatic hypertrophy.  Diagnoses and all orders for this visit:  BPH with urinary obstruction- His PSA is low which is reassuring that he does not have prostate cancer.  His symptoms are well controlled with Rapaflo, will continue. -     PSA, total and free; Future  Idiopathic gout, unspecified chronicity, unspecified site - He has had a couple recent episodes of gout arthropathy which have all resolved.  Will continue colchicine since he has responded well to it.  His uric acid goal is less than 6 and he currently stands at 6.7.  I have therefore asked him to start taking a xanthine oxidase inhibitor to reduce his risk of recurrent episodes of gouty arthropathy. -     Uric acid; Future -     Colchicine (MITIGARE) 0.6 MG CAPS; Take 1 tablet by mouth 2 (two) times daily. -     allopurinol  (ZYLOPRIM) 100 MG tablet; Take 1 tablet (100 mg total) by mouth daily.   I have discontinued Draylon Schaff's colchicine. I am also having him start on Colchicine and allopurinol. Additionally, I am having him maintain his aspirin, nitroGLYCERIN, carvedilol, ezetimibe, lisinopril, rosuvastatin, clopidogrel, and silodosin.  Meds ordered this encounter  Medications  . Colchicine (MITIGARE) 0.6 MG CAPS    Sig: Take 1 tablet by mouth 2 (two) times daily.    Dispense:  180 capsule    Refill:  1  . allopurinol (ZYLOPRIM) 100 MG tablet    Sig: Take 1 tablet (100 mg total) by mouth daily.    Dispense:  90 tablet  Refill:  1     Follow-up: Return in about 6 months (around 05/14/2018).  Scarlette Calico, MD

## 2017-11-12 LAB — PSA, TOTAL AND FREE
PSA, % Free: 57 % (calc) (ref >25)
PSA, Free: 0.4 ng/mL
PSA, Total: 0.7 ng/mL (ref <=4.0)

## 2017-11-13 ENCOUNTER — Encounter: Payer: Self-pay | Admitting: Internal Medicine

## 2017-11-16 NOTE — Telephone Encounter (Signed)
Insurance prefers colcichine tablets - is it okay to send tabs instead of capsules? Please advise.

## 2017-11-17 MED ORDER — COLCHICINE 0.6 MG PO TABS: 0.6000 mg | ORAL_TABLET | Freq: Every day | ORAL | 1 refills | Status: DC

## 2017-11-17 NOTE — Telephone Encounter (Signed)
Per PCP - okay to send in tablets instead of capsules.

## 2017-11-29 ENCOUNTER — Encounter: Payer: Self-pay | Admitting: Internal Medicine

## 2017-12-03 ENCOUNTER — Encounter: Payer: Self-pay | Admitting: Family Medicine

## 2017-12-03 ENCOUNTER — Ambulatory Visit: Payer: 59 | Admitting: Family Medicine

## 2017-12-03 DIAGNOSIS — M7022 Olecranon bursitis, left elbow: Secondary | ICD-10-CM

## 2017-12-03 DIAGNOSIS — M7020 Olecranon bursitis, unspecified elbow: Secondary | ICD-10-CM | POA: Insufficient documentation

## 2017-12-03 MED ORDER — CEPHALEXIN 500 MG PO CAPS: 500.0000 mg | ORAL_CAPSULE | Freq: Two times a day (BID) | ORAL | 0 refills | Status: DC

## 2017-12-03 NOTE — Patient Instructions (Signed)
Nice to meet you  Please try to keep it compressed for the next couple of days.  Please follow up with me if it swells up again.
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
## 2017-12-03 NOTE — Assessment & Plan Note (Signed)
Had a traumatic incidents to initiate the bursitis.  No overlying erythema or streaking. -Aspiration - keflex  - compression  - given indications to follow up.

## 2017-12-09 ENCOUNTER — Encounter: Payer: Self-pay | Admitting: Family Medicine

## 2017-12-10 ENCOUNTER — Ambulatory Visit: Payer: 59 | Admitting: Family Medicine

## 2017-12-10 ENCOUNTER — Encounter: Payer: Self-pay | Admitting: Family Medicine

## 2017-12-10 ENCOUNTER — Ambulatory Visit: Payer: Self-pay

## 2017-12-10 VITALS — BP 118/76 | HR 97 | Ht 72.0 in | Wt 185.0 lb

## 2017-12-10 DIAGNOSIS — M7022 Olecranon bursitis, left elbow: Secondary | ICD-10-CM | POA: Diagnosis not present

## 2017-12-10 NOTE — Progress Notes (Signed)
Gary Andrews - 62 y.o. male MRN [id number]  Date of birth: August 12, 1955  SUBJECTIVE:  Including CC & ROS.  Chief Complaint  Patient presents with  . Follow-up    Gary Andrews is a 62 y.o. male that is here today for left elbow bursitis follow up. He was seen on 12/03/17 which his elbow was drained. He states he kept compression for one day. He states the swelling has increased since last visit.     Review of Systems  Constitutional: Negative for fever.  HENT: Negative for congestion.   Respiratory: Negative for cough.   Cardiovascular: Negative for chest pain.  Gastrointestinal: Negative for abdominal pain.  Musculoskeletal: Negative for back pain, gait problem and joint swelling.  Skin: Negative for color change.  Neurological: Negative for weakness.  Hematological: Negative for adenopathy.  Psychiatric/Behavioral: Negative for agitation.    HISTORY: Past Medical, Surgical, Social, and Family History Reviewed & Updated per EMR.   Pertinent Historical Findings include:  Past Medical History:  Diagnosis Date  . Atrial fibrillation ([place])    a. Noted at time of STEMI 05/2012, converted on amiodarone.  Marland Kitchen CAD (coronary artery disease)    a. 2012 s/p stenting x 2 (3 mos apart) [facility] [place]. b. Acute ant-lat STEMI s/p DES to prox LAD 05/14/12, residual mod Cx dz for med rx - possible cardiac arrest (CPR).  . Cardiac arrest ([place])    a. 05/2012 - came in as bystander CPR, pulses back before EMS arrived at time of STEMI.  . Gout 2012  . HTN (hypertension)   . Hyperlipidemia   . Ischemic cardiomyopathy    a. EF 35% by cath at time of STEMI, improved to 50-55% at time of DC 05/2012.  . Transaminitis    a. At time of STEMI 05/2012 ?shock liver.    Past Surgical History:  Procedure Laterality Date  . CORONARY ANGIOPLASTY WITH STENT PLACEMENT  2013  . LEFT HEART CATHETERIZATION WITH CORONARY ANGIOGRAM N/A 05/14/2012   Procedure: LEFT HEART CATHETERIZATION WITH CORONARY ANGIOGRAM;  Surgeon:  Sherren Mocha, MD;  Location: [facility] CATH LAB;  Service: Cardiovascular;  Laterality: N/A;    No Known Allergies  Family History  Problem Relation Age of Onset  . CAD Father        alive  . Heart disease Father   . Heart attack Father   . Stroke Father   . CAD Brother        alive  . Heart disease Brother   . Heart disease Brother   . Alcohol abuse Mother   . Alcohol abuse Sister   . Cancer Neg Hx   . Diabetes Neg Hx   . Early death Neg Hx   . Hyperlipidemia Neg Hx   . Hypertension Neg Hx      Social History   Socioeconomic History  . Marital status: Married    Spouse name: Not on file  . Number of children: Not on file  . Years of education: Not on file  . Highest education level: Not on file  Occupational History  . Not on file  Social Needs  . Financial resource strain: Not on file  . Food insecurity:    Worry: Not on file    Inability: Not on file  . Transportation needs:    Medical: Not on file    Non-medical: Not on file  Tobacco Use  . Smoking status: Never Smoker  . Smokeless tobacco: Never Used  Substance and Sexual Activity  .  Alcohol use: Yes    Alcohol/week: 1.8 oz    Types: 3 Cans of beer per week    Comment: occasional drink.  . Drug use: No  . Sexual activity: Not Currently  Lifestyle  . Physical activity:    Days per week: Not on file    Minutes per session: Not on file  . Stress: Not on file  Relationships  . Social connections:    Talks on phone: Not on file    Gets together: Not on file    Attends religious service: Not on file    Active member of club or organization: Not on file    Attends meetings of clubs or organizations: Not on file    Relationship status: Not on file  . Intimate partner violence:    Fear of current or ex partner: Not on file    Emotionally abused: Not on file    Physically abused: Not on file    Forced sexual activity: Not on file  Other Topics Concern  . Not on file  Social History Narrative  . Not on  file     PHYSICAL EXAM:  VS: BP 118/76 (BP Location: Right Arm, Patient Position: Sitting, Cuff Size: Normal)   Pulse 97   Ht 6' (1.829 m)   Wt 185 lb (83.9 kg)   SpO2 95%   BMI 25.09 kg/m  Physical Exam Gen: NAD, alert, cooperative with exam, well-appearing ENT: normal lips, normal nasal mucosa,  Eye: normal EOM, normal conjunctiva and lids CV:  no edema, +2 pedal pulses   Resp: no accessory muscle use, non-labored,   Skin: no rashes, no areas of induration  Neuro: normal tone, normal sensation to touch Psych:  normal insight, alert and oriented MSK:  Left elbow  Enlarged olecranon bursa No redness or streaking  Normal AROM  Normal grip strength  Neurovascularly intact    Aspiration/Injection Procedure Note Gary Andrews [birth year]  Procedure: Aspiration Indications: left olecranon bursitis   Procedure Details Consent: Risks of procedure as well as the alternatives and risks of each were explained to the (patient/caregiver).  Consent for procedure obtained. Time Out: Verified patient identification, verified procedure, site/side was marked, verified correct patient position, special equipment/implants available, medications/allergies/relevent history reviewed, required imaging and test results available.  Performed.  The area was cleaned with iodine and alcohol swabs.    The left olecranon bursa was aspirated under ultrasound guidance. An 18 gauge needle was inserted under ultrasound guidance and images were taken in long axis. Aspiration was achieved.   Amount of Fluid Aspirated: 43mL Character of Fluid: red colored Fluid was sent for:n/a A sterile dressing was applied.  Patient did tolerate procedure well.       ASSESSMENT & PLAN:   Olecranon bursitis Ongoing swelling  - aspiration  - compression provided  - provided indications to follow up.

## 2017-12-10 NOTE — Patient Instructions (Signed)
Please try to keep the compression  Please try to ice the area.

## 2017-12-13 NOTE — Assessment & Plan Note (Signed)
Ongoing swelling  - aspiration  - compression provided  - provided indications to follow up.

## 2017-12-26 ENCOUNTER — Ambulatory Visit (HOSPITAL_COMMUNITY)
Admission: EM | Admit: 2017-12-26 | Discharge: 2017-12-26 | Disposition: A | Discharge status: Home / Self Care | Payer: 59 | Attending: Family Medicine | Admitting: Family Medicine

## 2017-12-26 ENCOUNTER — Encounter (HOSPITAL_COMMUNITY): Payer: Self-pay | Admitting: Emergency Medicine

## 2017-12-26 DIAGNOSIS — M7022 Olecranon bursitis, left elbow: Secondary | ICD-10-CM | POA: Diagnosis not present

## 2017-12-26 MED ORDER — METHYLPREDNISOLONE ACETATE 40 MG/ML IJ SUSP
10.0000 mg | Freq: Once | INTRAMUSCULAR | Status: AC
  Administered 2017-12-26: 10 mg via INTRALESIONAL

## 2017-12-26 MED ORDER — METHYLPREDNISOLONE ACETATE 40 MG/ML IJ SUSP
INTRAMUSCULAR | Status: AC
  Filled 2017-12-26: qty 1
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
## 2017-12-26 NOTE — ED Triage Notes (Signed)
Pt c/o fluid in his L elbow  For a few months, states a few months ago it was drained.

## 2017-12-26 NOTE — Discharge Instructions (Signed)
Ice Compression See ortho if fails to improve

## 2018-02-08 ENCOUNTER — Encounter: Payer: Self-pay | Admitting: Cardiovascular Disease

## 2018-02-08 NOTE — Telephone Encounter (Signed)
Called patient regarding MyChart message with c/o left chest/breast pain over the past few days.  Describes as "shooting" pain that radiates to his back. Hurts worse at some times rather than others, does not worsen with exertion or movement. Denies SOB, n/v C/o mild muscle tenderness if he presses on the area. States this does not feel like his chest pain prior to stenting to the best of his memory. States he recently did a lot of yard work recently but is concerned because the pain is not constant. Reports full ROM with left arm. I offered him an appointment for tomorrow. He states he works in [employer] and would like to come in on Friday afternoon if possible. I advised that there are no available appointments for Friday afternoon at this time. He states he will try Ibuprofen for the next 24 hours and monitor symptoms. He will contact me tomorrow to let me know if he feels like he needs an appointment. He thanked me for calling him.

## 2018-02-09 ENCOUNTER — Telehealth: Payer: Self-pay | Admitting: Nurse Practitioner

## 2018-02-09 NOTE — Telephone Encounter (Signed)
Called patient to follow-up on complaint of chest discomfort from MyChart message sent yesterday. Patient was going to monitor symptoms overnight and call back today to determine if and when an appointment is needed. I left him a message to call back and ask for Triage so that one of the nurses can get a message to Dr. Acie Fredrickson due to I will be out of the office this afternoon.

## 2018-05-26 ENCOUNTER — Encounter: Payer: Self-pay | Admitting: Internal Medicine

## 2018-05-26 ENCOUNTER — Ambulatory Visit: Payer: 59 | Admitting: Internal Medicine

## 2018-05-26 ENCOUNTER — Other Ambulatory Visit (INDEPENDENT_AMBULATORY_CARE_PROVIDER_SITE_OTHER): Payer: 59

## 2018-05-26 VITALS — BP 140/88 | HR 69 | Temp 97.7°F | Ht 72.0 in | Wt 187.2 lb

## 2018-05-26 DIAGNOSIS — N410 Acute prostatitis: Secondary | ICD-10-CM

## 2018-05-26 DIAGNOSIS — N401 Enlarged prostate with lower urinary tract symptoms: Secondary | ICD-10-CM | POA: Diagnosis not present

## 2018-05-26 DIAGNOSIS — R300 Dysuria: Secondary | ICD-10-CM

## 2018-05-26 DIAGNOSIS — N138 Other obstructive and reflux uropathy: Secondary | ICD-10-CM

## 2018-05-26 LAB — URINALYSIS, ROUTINE W REFLEX MICROSCOPIC
Nitrite: NEGATIVE
Specific Gravity, Urine: 1.025 (ref 1.000–1.030)
Total Protein, Urine: 100 — AB
Urine Glucose: NEGATIVE
Urobilinogen, UA: 1 (ref 0.0–1.0)
pH: 6 (ref 5.0–8.0)

## 2018-05-26 MED ORDER — LEVOFLOXACIN 500 MG PO TABS: 500.0000 mg | ORAL_TABLET | Freq: Every day | ORAL | 0 refills | Status: DC

## 2018-05-26 NOTE — Patient Instructions (Signed)
Prostatitis Prostatitis is swelling or inflammation of the prostate gland. The prostate is a walnut-sized gland that is involved in the production of semen. It is located below a man's bladder, in front of the rectum. There are four types of prostatitis:  Chronic nonbacterial prostatitis. This is the most common type of prostatitis. It may be associated with a viral infection or autoimmune disorder.  Acute bacterial prostatitis. This is the least common type of prostatitis. It starts quickly and is usually associated with a bladder infection, high fever, and shaking chills. It can occur at any age.  Chronic bacterial prostatitis. This type usually results from acute bacterial prostatitis that happens repeatedly (is recurrent) or has not been treated properly. It can occur in men of any age but is most common among middle-aged men whose prostate has begun to get larger. The symptoms are not as severe as symptoms caused by acute bacterial prostatitis.  Prostatodynia or chronic pelvic pain syndrome (CPPS). This type is also called pelvic floor disorder. It is associated with increased muscular tone in the pelvis surrounding the prostate. What are the causes? Bacterial prostatitis is caused by infection from bacteria. Chronic nonbacterial prostatitis may be caused by:  Urinary tract infections (UTIs).  Nerve damage.  A response by the body's disease-fighting system (autoimmune response).  Chemicals in the urine. The causes of the other types of prostatitis are usually not known. What are the signs or symptoms? Symptoms of this condition vary depending upon the type of prostatitis. If you have acute bacterial prostatitis, you may experience:  Urinary symptoms, such as:  Painful urination.  Burning during urination.  Frequent and sudden urges to urinate.  Inability to start urinating.  A weak or interrupted stream of urine.  Vomiting.  Nausea.  Fever.  Chills.  Inability to  empty the bladder completely.  Pain in the:  Muscles or joints.  Lower back.  Lower abdomen. If you have any of the other types of prostatitis, you may experience:  Urinary symptoms, such as:  Sudden urges to urinate.  Frequent urination.  Difficulty starting urination.  Weak urine stream.  Dribbling after urination.  Discharge from the urethra. The urethra is a tube that opens at the end of the penis.  Pain in the:  Testicles.  Penis or tip of the penis.  Rectum.  Area in front of the rectum and below the scrotum (perineum).  Problems with sexual function.  Painful ejaculation.  Bloody semen. How is this diagnosed? This condition may be diagnosed based on:  A physical and medical exam.  Your symptoms.  A urine test to check for bacteria.  An exam in which a health care provider uses a finger to feel the prostate (digital rectal exam).  A test of a sample of semen.  Blood tests.  Ultrasound.  Removal of prostate tissue to be examined under a microscope (biopsy).  Tests to check how your body handles urine (urodynamic tests).  A test to look inside your bladder or urethra (cystoscopy). How is this treated? Treatment for this condition depends on the type of prostatitis. Treatment may involve:  Medicines to relieve pain or inflammation.  Medicines to help relax your muscles.  Physical therapy.  Heat therapy.  Techniques to help you control certain body functions (biofeedback).  Relaxation exercises.  Antibiotic medicine, if your condition is caused by bacteria.  Warm water baths (sitz baths). Sitz baths help with relaxing your pelvic floor muscles, which helps to relieve pressure on the prostate. Follow   these instructions at home:  Take over-the-counter and prescription medicines only as told by your health care provider.  If you were prescribed an antibiotic, take it as told by your health care provider. Do not stop taking the  antibiotic even if you start to feel better.  If physical therapy, biofeedback, or relaxation exercises were prescribed, do exercises as instructed.  Take sitz baths as directed by your health care provider. For a sitz bath, sit in warm water that is deep enough to cover your hips and buttocks.  Keep all follow-up visits as told by your health care provider. This is important. Contact a health care provider if:  Your symptoms get worse.  You have a fever. Get help right away if:  You have chills.  You feel nauseous.  You vomit.  You feel light-headed or feel like you are going to faint.  You are unable to urinate.  You have blood or blood clots in your urine. This information is not intended to replace advice given to you by your health care provider. Make sure you discuss any questions you have with your health care provider. Document Released: 06/26/2000 Document Revised: 03/19/2016 Document Reviewed: 03/19/2016 Elsevier Interactive Patient Education  2017 Elsevier Inc.  

## 2018-05-26 NOTE — Progress Notes (Signed)
Subjective:  Patient ID: Gary Andrews, male    DOB: [birth year]  Age: 62 y.o. MRN: [id number]  CC: Dysuria   HPI Gary Andrews presents for a 2-day history of urinary hesitancy, dysuria, and chills.  Outpatient Medications Prior to Visit  Medication Sig Dispense Refill  . aspirin 81 MG tablet Take 1 tablet (81 mg total) by mouth daily.    . carvedilol (COREG) 3.125 MG tablet Take 1 tablet (3.125 mg total) by mouth 2 (two) times daily with a meal. 180 tablet 3  . clopidogrel (PLAVIX) 75 MG tablet Take 1 tablet (75 mg total) by mouth daily. Start the day after you finish Effient 90 tablet 3  . ezetimibe (ZETIA) 10 MG tablet Take 1 tablet (10 mg total) by mouth daily. 90 tablet 3  . lisinopril (PRINIVIL,ZESTRIL) 2.5 MG tablet Take 1 tablet (2.5 mg total) by mouth daily. 90 tablet 3  . rosuvastatin (CRESTOR) 20 MG tablet Take 1 tablet (20 mg total) by mouth daily. 90 tablet 3  . silodosin (RAPAFLO) 4 MG CAPS capsule Take 1 capsule (4 mg total) by mouth daily with breakfast. 90 capsule 1  . allopurinol (ZYLOPRIM) 100 MG tablet Take 1 tablet (100 mg total) by mouth daily. (Patient not taking: Reported on 05/26/2018) 90 tablet 1  . colchicine 0.6 MG tablet Take 1 tablet (0.6 mg total) by mouth daily. (Patient not taking: Reported on 05/26/2018) 60 tablet 1  . nitroGLYCERIN (NITROSTAT) 0.4 MG SL tablet Place 1 tablet (0.4 mg total) under the tongue every 5 (five) minutes as needed for chest pain (up to 3 doses). Please keep upcoming appt. (Patient not taking: Reported on 05/26/2018) 25 tablet 0  . cephALEXin (KEFLEX) 500 MG capsule Take 1 capsule (500 mg total) by mouth 2 (two) times daily. 6 capsule 0   No facility-administered medications prior to visit.     ROS Review of Systems  Constitutional: Positive for chills.  HENT: Negative.   Respiratory: Negative.  Negative for cough and shortness of breath.   Cardiovascular: Negative for chest pain.  Gastrointestinal: Negative for abdominal pain,  diarrhea and nausea.  Endocrine: Negative.   Genitourinary: Positive for difficulty urinating and dysuria. Negative for decreased urine volume, enuresis, flank pain, frequency, genital sores, hematuria, penile pain, penile swelling, scrotal swelling, testicular pain and urgency.  Skin: Negative.   Neurological: Negative.   Hematological: Negative for adenopathy. Does not bruise/bleed easily.  Psychiatric/Behavioral: Negative.     Objective:  BP 140/88 (BP Location: Left Arm, Patient Position: Sitting, Cuff Size: Normal)   Pulse 69   Temp 97.7 F (36.5 C) (Oral)   Ht 6' (1.829 m)   Wt 187 lb 4 oz (84.9 kg)   SpO2 97%   BMI 25.40 kg/m   BP Readings from Last 3 Encounters:  05/26/18 140/88  12/26/17 132/80  12/10/17 118/76    Wt Readings from Last 3 Encounters:  05/26/18 187 lb 4 oz (84.9 kg)  12/10/17 185 lb (83.9 kg)  12/03/17 189 lb (85.7 kg)    Physical Exam  Constitutional: He is oriented to person, place, and time. No distress.  HENT:  Mouth/Throat: Oropharynx is clear and moist. No oropharyngeal exudate.  Eyes: Conjunctivae are normal. No scleral icterus.  Neck: Normal range of motion. Neck supple. No JVD present. No thyromegaly present.  Cardiovascular: Normal rate, regular rhythm and normal heart sounds.  No murmur heard. Pulmonary/Chest: Effort normal and breath sounds normal. No respiratory distress. He has no wheezes. He has no rhonchi.  He has no rales.  Abdominal: Soft. Bowel sounds are normal. He exhibits no mass. There is no hepatosplenomegaly. There is no tenderness. Hernia confirmed negative in the right inguinal area and confirmed negative in the left inguinal area.  Genitourinary: Testes normal and penis normal. Rectal exam shows external hemorrhoid and internal hemorrhoid. Rectal exam shows no fissure, no mass, no tenderness, anal tone normal and guaiac negative stool. Prostate is enlarged and tender. Right testis shows no mass, no swelling and no  tenderness. Left testis shows no mass and no swelling. Circumcised. No penile erythema or penile tenderness. No discharge found.  Genitourinary Comments: 3+ metrical, bilateral BPH with bogginess and tenderness  Musculoskeletal: He exhibits no edema, tenderness or deformity.  Lymphadenopathy:    He has no cervical adenopathy. No inguinal adenopathy noted on the right side.  Neurological: He is alert and oriented to person, place, and time.  Skin: He is not diaphoretic.  Vitals reviewed.   Lab Results  Component Value Date   WBC 4.0 (L) 07/22/2012   HGB 14.9 07/22/2012   HCT 44.2 07/22/2012   PLT 185.0 07/22/2012   GLUCOSE 103 (H) 08/30/2017   CHOL 123 08/30/2017   TRIG 64 08/30/2017   HDL 55 08/30/2017   LDLDIRECT 167.3 07/26/2013   LDLCALC 55 08/30/2017   ALT 39 08/30/2017   AST 37 08/30/2017   NA 144 08/30/2017   K 5.1 08/30/2017   CL 105 08/30/2017   CREATININE 0.95 08/30/2017   BUN 15 08/30/2017   CO2 27 08/30/2017   TSH 1.73 07/22/2012   PSA 0.7 11/11/2017   INR 1.16 05/14/2012   HGBA1C 5.6 07/22/2012   Results for HERVEY, WEDIG (MRN [id number]) as of 05/26/2018 13:36  Ref. Range 07/22/2012 10:56 09/09/2017 11:44 05/26/2018 12:08  URINALYSIS, ROUTINE W REFLEX MICROSCOPIC Unknown Rpt Rpt Rpt (A)  Appearance Latest Ref Range: Clear  CLEAR CLEAR CLOUDY (A)  Bilirubin Urine Latest Ref Range: Negative  NEGATIVE NEGATIVE SMALL (A)  Color, Urine Latest Ref Range: Yellow;Lt. Yellow  LT. YELLOW YELLOW YELLOW  Hgb urine dipstick Latest Ref Range: Negative  NEGATIVE NEGATIVE LARGE (A)  Ketones, ur Latest Ref Range: Negative  NEGATIVE NEGATIVE TRACE (A)  Leukocytes, UA Latest Ref Range: Negative  NEGATIVE NEGATIVE MODERATE (A)  Nitrite Latest Ref Range: Negative  NEGATIVE NEGATIVE NEGATIVE  pH Latest Ref Range: 5.0 - 8.0  6.0 6.0 6.0  Specific Gravity, Urine Latest Ref Range: 1.000 - 1.030  1.015 1.025 1.025  Urine Glucose Latest Ref Range: Negative  NEGATIVE NEGATIVE NEGATIVE    Urobilinogen, UA Latest Ref Range: 0.0 - 1.0  0.2 0.2 1.0  Bacteria, UA Latest Ref Range: None    Rare(<10/hpf) (A)  RBC / HPF Latest Ref Range: 0-2/hpf   none seen TNTC(>50/hpf) (A)  WBC, UA Latest Ref Range: 0-2/hpf  0-2/hpf 0-2/hpf TNTC(>50/hpf) (A)  Total Protein, Urine-UPE24 Latest Ref Range: Negative  NEGATIVE NEGATIVE 100 (A)     Assessment & Plan:   Diamante was seen today for dysuria.  Diagnoses and all orders for this visit:  Dysuria- His UA is remarkably abnormal.  There are TNTC red blood cells and white blood cells.  Will empirically treat for acute bacterial prostatitis with a fluoroquinolone.  I have ordered a urine culture to try to identify the bacteria.  -     Urinalysis, Routine w reflex microscopic; Future -     CULTURE, URINE COMPREHENSIVE; Future  BPH with urinary obstruction  Acute prostatitis without hematuria -  levofloxacin (LEVAQUIN) 500 MG tablet; Take 1 tablet (500 mg total) by mouth daily. -     Urinalysis, Routine w reflex microscopic; Future -     CULTURE, URINE COMPREHENSIVE; Future   I have discontinued Brenden Fratus's cephALEXin. I am also having him start on levofloxacin. Additionally, I am having him maintain his aspirin, nitroGLYCERIN, carvedilol, ezetimibe, lisinopril, rosuvastatin, clopidogrel, silodosin, allopurinol, and colchicine.  Meds ordered this encounter  Medications  . levofloxacin (LEVAQUIN) 500 MG tablet    Sig: Take 1 tablet (500 mg total) by mouth daily.    Dispense:  30 tablet    Refill:  0     Follow-up: Return in about 4 weeks (around 06/23/2018).  Scarlette Calico, MD

## 2018-10-10 ENCOUNTER — Other Ambulatory Visit: Payer: Self-pay | Admitting: Physician Assistant

## 2018-10-10 DIAGNOSIS — I10 Essential (primary) hypertension: Secondary | ICD-10-CM

## 2019-01-07 ENCOUNTER — Other Ambulatory Visit: Payer: Self-pay | Admitting: Physician Assistant

## 2019-01-07 DIAGNOSIS — I10 Essential (primary) hypertension: Secondary | ICD-10-CM

## 2019-01-10 ENCOUNTER — Telehealth: Payer: Self-pay | Admitting: Physician Assistant

## 2019-01-10 NOTE — Telephone Encounter (Signed)
°*  STAT* If patient is at the pharmacy, call can be transferred to refill team.   1. Which medications need to be refilled? (please list name of each medication and dose if known) Ezetimibe 10 mg Lisinopril 205 mg carvedilol 3.125 mg rosuvastatin 20 mg   2. Which pharmacy/location (including street and city if local pharmacy) is medication to be sent to? [facility]  3. Do they need a 30 day or 90 day supply? [place]

## 2019-01-18 ENCOUNTER — Other Ambulatory Visit (INDEPENDENT_AMBULATORY_CARE_PROVIDER_SITE_OTHER): Payer: 59

## 2019-01-18 ENCOUNTER — Ambulatory Visit (INDEPENDENT_AMBULATORY_CARE_PROVIDER_SITE_OTHER): Payer: 59 | Admitting: Internal Medicine

## 2019-01-18 ENCOUNTER — Other Ambulatory Visit: Payer: Self-pay

## 2019-01-18 ENCOUNTER — Encounter: Payer: Self-pay | Admitting: Internal Medicine

## 2019-01-18 ENCOUNTER — Ambulatory Visit: Payer: 59 | Admitting: Internal Medicine

## 2019-01-18 VITALS — BP 124/80 | HR 60 | Temp 97.5°F | Ht 72.0 in | Wt 183.0 lb

## 2019-01-18 DIAGNOSIS — R300 Dysuria: Secondary | ICD-10-CM | POA: Diagnosis not present

## 2019-01-18 DIAGNOSIS — Z0000 Encounter for general adult medical examination without abnormal findings: Secondary | ICD-10-CM | POA: Diagnosis not present

## 2019-01-18 DIAGNOSIS — M10072 Idiopathic gout, left ankle and foot: Secondary | ICD-10-CM | POA: Diagnosis not present

## 2019-01-18 DIAGNOSIS — I2510 Atherosclerotic heart disease of native coronary artery without angina pectoris: Secondary | ICD-10-CM | POA: Diagnosis not present

## 2019-01-18 DIAGNOSIS — K7010 Alcoholic hepatitis without ascites: Secondary | ICD-10-CM

## 2019-01-18 DIAGNOSIS — E785 Hyperlipidemia, unspecified: Secondary | ICD-10-CM | POA: Diagnosis not present

## 2019-01-18 DIAGNOSIS — R7309 Other abnormal glucose: Secondary | ICD-10-CM

## 2019-01-18 DIAGNOSIS — R3916 Straining to void: Secondary | ICD-10-CM

## 2019-01-18 DIAGNOSIS — N401 Enlarged prostate with lower urinary tract symptoms: Secondary | ICD-10-CM

## 2019-01-18 DIAGNOSIS — Z1211 Encounter for screening for malignant neoplasm of colon: Secondary | ICD-10-CM

## 2019-01-18 LAB — HEPATIC FUNCTION PANEL
ALT: 45 U/L (ref 0–53)
AST: 38 U/L — ABNORMAL HIGH (ref 0–37)
Albumin: 4.9 g/dL (ref 3.5–5.2)
Alkaline Phosphatase: 59 U/L (ref 39–117)
Bilirubin, Direct: 0.1 mg/dL (ref 0.0–0.3)
Total Bilirubin: 0.8 mg/dL (ref 0.2–1.2)
Total Protein: 7.1 g/dL (ref 6.0–8.3)

## 2019-01-18 LAB — CBC WITH DIFFERENTIAL/PLATELET
Basophils Absolute: 0 10*3/uL (ref 0.0–0.1)
Basophils Relative: 0.6 % (ref 0.0–3.0)
Eosinophils Absolute: 0.1 10*3/uL (ref 0.0–0.7)
Eosinophils Relative: 1.3 % (ref 0.0–5.0)
HCT: 45.4 % (ref 39.0–52.0)
Hemoglobin: 15.2 g/dL (ref 13.0–17.0)
Lymphocytes Relative: 26.9 % (ref 12.0–46.0)
Lymphs Abs: 1.2 10*3/uL (ref 0.7–4.0)
MCHC: 33.5 g/dL (ref 30.0–36.0)
MCV: 95.2 fl (ref 78.0–100.0)
Monocytes Absolute: 0.5 10*3/uL (ref 0.1–1.0)
Monocytes Relative: 11 % (ref 3.0–12.0)
Neutro Abs: 2.8 10*3/uL (ref 1.4–7.7)
Neutrophils Relative %: 60.2 % (ref 43.0–77.0)
Platelets: 189 10*3/uL (ref 150.0–400.0)
RBC: 4.77 Mil/uL (ref 4.22–5.81)
RDW: 14.2 % (ref 11.5–15.5)
WBC: 4.6 10*3/uL (ref 4.0–10.5)

## 2019-01-18 LAB — TSH: TSH: 1.86 u[IU]/mL (ref 0.35–4.50)

## 2019-01-18 LAB — LIPID PANEL
Cholesterol: 156 mg/dL (ref 0–200)
HDL: 50.2 mg/dL (ref >39.00)
LDL Cholesterol: 85 mg/dL (ref 0–99)
NonHDL: 106.22
Total CHOL/HDL Ratio: 3
Triglycerides: 105 mg/dL (ref 0.0–149.0)
VLDL: 21 mg/dL (ref 0.0–40.0)

## 2019-01-18 LAB — BASIC METABOLIC PANEL
BUN: 15 mg/dL (ref 6–23)
CO2: 30 mEq/L (ref 19–32)
Calcium: 9.7 mg/dL (ref 8.4–10.5)
Chloride: 105 mEq/L (ref 96–112)
Creatinine, Ser: 0.85 mg/dL (ref 0.40–1.50)
GFR: 91.53 mL/min (ref >60.00)
Glucose, Bld: 87 mg/dL (ref 70–99)
Potassium: 4.9 mEq/L (ref 3.5–5.1)
Sodium: 142 mEq/L (ref 135–145)

## 2019-01-18 LAB — HEMOGLOBIN A1C: Hgb A1c MFr Bld: 5.7 % (ref 4.6–6.5)

## 2019-01-18 LAB — URIC ACID: Uric Acid, Serum: 4.2 mg/dL (ref 4.0–7.8)

## 2019-01-18 LAB — PSA: PSA: 2.57 ng/mL (ref 0.10–4.00)

## 2019-01-18 MED ORDER — GLOPERBA 0.6 MG/5ML PO SOLN: 5.0000 mL | Freq: Two times a day (BID) | ORAL | 0 refills | Status: DC

## 2019-01-18 MED ORDER — METHYLPREDNISOLONE ACETATE 80 MG/ML IJ SUSP: 80.0000 mg | Freq: Once | INTRAMUSCULAR | Status: DC

## 2019-01-18 MED ORDER — METHYLPREDNISOLONE ACETATE 80 MG/ML IJ SUSP
120.0000 mg | Freq: Once | INTRAMUSCULAR | Status: AC
  Administered 2019-01-18: 120 mg via INTRAMUSCULAR

## 2019-01-18 MED ORDER — OXYCODONE HCL 10 MG PO TABS: 10.0000 mg | ORAL_TABLET | Freq: Four times a day (QID) | ORAL | 0 refills | Status: AC | PRN

## 2019-01-18 MED ORDER — KETOROLAC TROMETHAMINE 60 MG/2ML IM SOLN
60.0000 mg | Freq: Once | INTRAMUSCULAR | Status: AC
  Administered 2019-01-18: 60 mg via INTRAMUSCULAR

## 2019-01-18 NOTE — Progress Notes (Signed)
Subjective:  Patient ID: Gary Andrews, male    DOB: 03/10/1956  Age: 63 y.o. MRN: [id number]  CC: Annual Exam, Hyperlipidemia, and Gout   HPI Gary Andrews presents for a CPX.  He complains of a 4-day history of nontraumatic redness, pain, swelling at the base of his left big toe.  He has tried to control the pain with Motrin but has not gotten much symptom relief.  He has a history of gout so he restarted allopurinol which he thinks has not helped.  The pain interferes with his activities and keeps him awake at night.  He also complains of difficulty with urination.  He stands over the toilet and has to strain to urinate.  Outpatient Medications Prior to Visit  Medication Sig Dispense Refill  . allopurinol (ZYLOPRIM) 100 MG tablet Take 1 tablet (100 mg total) by mouth daily. (Patient not taking: Reported on 05/26/2018) 90 tablet 1  . aspirin 81 MG tablet Take 1 tablet (81 mg total) by mouth daily.    . carvedilol (COREG) 3.125 MG tablet Take 1 tablet (3.125 mg total) by mouth 2 (two) times daily with a meal. MUST SCHEDULE APPOINTMENT 60 tablet 0  . doxycycline (VIBRAMYCIN) 100 MG capsule TK 1 C PO QD    . ezetimibe (ZETIA) 10 MG tablet Take 1 tablet (10 mg total) by mouth daily. APPOINTMENT IS NEEDED 30 tablet 0  . lisinopril (ZESTRIL) 2.5 MG tablet Take 1 tablet (2.5 mg total) by mouth daily. APPOINTMENT IS NEEDED 30 tablet 0  . nitroGLYCERIN (NITROSTAT) 0.4 MG SL tablet Place 1 tablet (0.4 mg total) under the tongue every 5 (five) minutes as needed for chest pain (up to 3 doses). Please keep upcoming appt. (Patient not taking: Reported on 05/26/2018) 25 tablet 0  . rosuvastatin (CRESTOR) 20 MG tablet Take 1 tablet (20 mg total) by mouth daily. APPOINTMENT IS NEEDED 30 tablet 0  . colchicine 0.6 MG tablet Take 1 tablet (0.6 mg total) by mouth daily. (Patient not taking: Reported on 05/26/2018) 60 tablet 1  . levofloxacin (LEVAQUIN) 500 MG tablet Take 1 tablet (500 mg total) by mouth daily. 30  tablet 0  . silodosin (RAPAFLO) 4 MG CAPS capsule Take 1 capsule (4 mg total) by mouth daily with breakfast. 90 capsule 1   No facility-administered medications prior to visit.     ROS Review of Systems  Constitutional: Negative.  Negative for chills, diaphoresis, fatigue and fever.  HENT: Negative.   Eyes: Negative for visual disturbance.  Respiratory: Negative for cough, chest tightness, shortness of breath and wheezing.   Cardiovascular: Negative for chest pain, palpitations and leg swelling.  Gastrointestinal: Negative for abdominal pain, constipation, diarrhea, nausea and vomiting.  Endocrine: Negative.   Genitourinary: Positive for difficulty urinating. Negative for dysuria, frequency, hematuria, scrotal swelling, testicular pain and urgency.  Musculoskeletal: Positive for arthralgias. Negative for myalgias.  Skin: Negative for rash.  Neurological: Negative.  Negative for dizziness.  Hematological: Negative for adenopathy. Does not bruise/bleed easily.  Psychiatric/Behavioral: Negative.     Objective:  BP 124/80 (BP Location: Left Arm, Patient Position: Sitting, Cuff Size: Normal)   Pulse 60   Temp (!) 97.5 F (36.4 C) (Oral)   Ht 6' (1.829 m)   Wt 183 lb (83 kg)   SpO2 98%   BMI 24.82 kg/m   BP Readings from Last 3 Encounters:  01/18/19 124/80  05/26/18 140/88  12/26/17 132/80    Wt Readings from Last 3 Encounters:  01/18/19 183 lb (83  kg)  05/26/18 187 lb 4 oz (84.9 kg)  12/10/17 185 lb (83.9 kg)    Physical Exam Vitals signs reviewed.  Constitutional:      Appearance: He is not ill-appearing or diaphoretic.  HENT:     Nose: No congestion.     Mouth/Throat:     Mouth: Mucous membranes are moist.     Pharynx: No oropharyngeal exudate.  Eyes:     General: No scleral icterus.    Conjunctiva/sclera: Conjunctivae normal.  Neck:     Musculoskeletal: Normal range of motion. No neck rigidity or muscular tenderness.  Cardiovascular:     Rate and Rhythm:  Normal rate and regular rhythm.     Heart sounds: No murmur.  Pulmonary:     Effort: Pulmonary effort is normal.     Breath sounds: No stridor. No wheezing, rhonchi or rales.  Abdominal:     General: Abdomen is flat. Bowel sounds are normal. There is no distension.     Palpations: There is no hepatomegaly or splenomegaly.     Tenderness: There is no abdominal tenderness.     Hernia: No hernia is present. There is no hernia in the left inguinal area or right inguinal area.  Genitourinary:    Pubic Area: No rash.      Penis: Normal and circumcised. No discharge, swelling or lesions.      Scrotum/Testes: Normal.        Right: Mass, tenderness or swelling not present.        Left: Mass, tenderness or swelling not present.     Epididymis:     Right: Normal. Not inflamed or enlarged. No mass or tenderness.     Left: Normal. Not inflamed or enlarged. No mass or tenderness.     Prostate: Enlarged (1+ smooth symm BPH). Not tender and no nodules present.     Rectum: Guaiac result negative. External hemorrhoid present. No mass, tenderness, anal fissure or internal hemorrhoid. Normal anal tone.  Musculoskeletal: Normal range of motion.     Right lower leg: No edema.     Left lower leg: No edema.  Lymphadenopathy:     Cervical: No cervical adenopathy.     Lower Body: No right inguinal adenopathy. No left inguinal adenopathy.  Skin:    General: Skin is warm and dry.     Coloration: Skin is not pale.  Neurological:     General: No focal deficit present.  Psychiatric:        Mood and Affect: Mood normal.        Behavior: Behavior normal.     Lab Results  Component Value Date   WBC 4.6 01/18/2019   HGB 15.2 01/18/2019   HCT 45.4 01/18/2019   PLT 189.0 01/18/2019   GLUCOSE 87 01/18/2019   CHOL 156 01/18/2019   TRIG 105.0 01/18/2019   HDL 50.20 01/18/2019   LDLDIRECT 167.3 07/26/2013   LDLCALC 85 01/18/2019   ALT 45 01/18/2019   AST 38 (H) 01/18/2019   NA 142 01/18/2019   K 4.9  01/18/2019   CL 105 01/18/2019   CREATININE 0.85 01/18/2019   BUN 15 01/18/2019   CO2 30 01/18/2019   TSH 1.86 01/18/2019   PSA 2.57 01/18/2019   INR 1.16 05/14/2012   HGBA1C 5.7 01/18/2019    No results found.  Assessment & Plan:   Gary Andrews was seen today for annual exam, hyperlipidemia and gout.  Diagnoses and all orders for this visit:  Dysuria  Acute idiopathic gout involving  toe of left foot- Since he was not taking allopurinol when the gout flare started I have asked him to stop taking allopurinol until this gout flare resolves and then to restart the allopurinol at the previous dose.  This is a rather severe gout flare so I have treated him with an injection of methylprednisolone and Toradol 60 mg IM.  He will continue to control the pain with ibuprofen as needed.  I have also asked him to use oxycodone and colchicine to control the pain. -     Uric acid; Future -     Basic metabolic panel; Future -     Oxycodone HCl 10 MG TABS; Take 1 tablet (10 mg total) by mouth every 6 (six) hours as needed for up to 5 days. -     Colchicine (GLOPERBA) 0.6 MG/5ML SOLN; Take 5 mLs by mouth 2 (two) times a day. -     ketorolac (TORADOL) injection 60 mg -     Discontinue: methylPREDNISolone acetate (DEPO-MEDROL) injection 80 mg -     methylPREDNISolone acetate (DEPO-MEDROL) injection 120 mg  Routine general medical examination at a health care facility- Exam completed, labs reviewed, vaccines reviewed, Cologuard ordered to screen for colon cancer/polyps, patient education material was given. -     Lipid panel; Future -     PSA; Future -     HIV Antibody (routine testing w rflx); Future  Other abnormal glucose- His blood sugars are normal now. -     Hemoglobin A1c; Future -     Basic metabolic panel; Future  Coronary artery disease involving native coronary artery of native heart without angina pectoris- He has had no recent episodes of angina.  Will continue risk factor modifications. -      CBC with Differential/Platelet; Future  Hyperlipidemia LDL goal <70- He has achieved his LDL goal and is doing well on the statin. -     TSH; Future -     Hepatic function panel; Future  Colon cancer screening -     Cologuard  Benign prostatic hyperplasia (BPH) with straining on urination- He is symptomatic and his PSA is just above 2.5 so I recommended that he start taking an alpha-blocker as well as a 5 alpha reductase inhibitor. -     alfuzosin (UROXATRAL) 10 MG 24 hr tablet; Take 1 tablet (10 mg total) by mouth daily with breakfast. -     finasteride (PROSCAR) 5 MG tablet; Take 1 tablet (5 mg total) by mouth daily.  Alcoholic hepatitis without ascites- His LFTs remain mildly elevated and he admits to significant intake of alcohol.  I have asked him to limit his alcohol intake to less than 3 beverages a day or to abstain from alcohol altogether.   I have discontinued Deigo Bonnin's silodosin, colchicine, and levofloxacin. I am also having him start on Oxycodone HCl, Gloperba, alfuzosin, and finasteride. Additionally, I am having him maintain his aspirin, nitroGLYCERIN, allopurinol, ezetimibe, carvedilol, rosuvastatin, lisinopril, and doxycycline. We administered ketorolac and methylPREDNISolone acetate.  Meds ordered this encounter  Medications  . Oxycodone HCl 10 MG TABS    Sig: Take 1 tablet (10 mg total) by mouth every 6 (six) hours as needed for up to 5 days.    Dispense:  25 tablet    Refill:  0  . Colchicine (GLOPERBA) 0.6 MG/5ML SOLN    Sig: Take 5 mLs by mouth 2 (two) times a day.    Dispense:  300 mL    Refill:  0  .  ketorolac (TORADOL) injection 60 mg  . DISCONTD: methylPREDNISolone acetate (DEPO-MEDROL) injection 80 mg  . methylPREDNISolone acetate (DEPO-MEDROL) injection 120 mg  . alfuzosin (UROXATRAL) 10 MG 24 hr tablet    Sig: Take 1 tablet (10 mg total) by mouth daily with breakfast.    Dispense:  90 tablet    Refill:  1  . finasteride (PROSCAR) 5 MG tablet     Sig: Take 1 tablet (5 mg total) by mouth daily.    Dispense:  90 tablet    Refill:  1     Follow-up: Return in about 3 months (around 04/20/2019).  Scarlette Calico, MD

## 2019-01-18 NOTE — Patient Instructions (Signed)
Gout  Gout is a condition that causes painful swelling of the joints. Gout is a type of inflammation of the joints (arthritis). This condition is caused by having too much uric acid in the body. Uric acid is a chemical that forms when the body breaks down substances called purines. Purines are important for building body proteins. When the body has too much uric acid, sharp crystals can form and build up inside the joints. This causes pain and swelling. Gout attacks can happen quickly and may be very painful (acute gout). Over time, the attacks can affect more joints and become more frequent (chronic gout). Gout can also cause uric acid to build up under the skin and inside the kidneys. What are the causes? This condition is caused by too much uric acid in your blood. This can happen because:  Your kidneys do not remove enough uric acid from your blood. This is the most common cause.  Your body makes too much uric acid. This can happen with some cancers and cancer treatments. It can also occur if your body is breaking down too many red blood cells (hemolytic anemia).  You eat too many foods that are high in purines. These foods include organ meats and some seafood. Alcohol, especially beer, is also high in purines. A gout attack may be triggered by trauma or stress. What increases the risk? You are more likely to develop this condition if you:  Have a family history of gout.  Are male and middle-aged.  Are male and have gone through menopause.  Are obese.  Frequently drink alcohol, especially beer.  Are dehydrated.  Lose weight too quickly.  Have an organ transplant.  Have lead poisoning.  Take certain medicines, including aspirin, cyclosporine, diuretics, levodopa, and niacin.  Have kidney disease.  Have a skin condition called psoriasis. What are the signs or symptoms? An attack of acute gout happens quickly. It usually occurs in just one joint. The most common place is  the big toe. Attacks often start at night. Other joints that may be affected include joints of the feet, ankle, knee, fingers, wrist, or elbow. Symptoms of this condition may include:  Severe pain.  Warmth.  Swelling.  Stiffness.  Tenderness. The affected joint may be very painful to touch.  Shiny, red, or purple skin.  Chills and fever. Chronic gout may cause symptoms more frequently. More joints may be involved. You may also have white or yellow lumps (tophi) on your hands or feet or in other areas near your joints. How is this diagnosed? This condition is diagnosed based on your symptoms, medical history, and physical exam. You may have tests, such as:  Blood tests to measure uric acid levels.  Removal of joint fluid with a thin needle (aspiration) to look for uric acid crystals.  X-rays to look for joint damage. How is this treated? Treatment for this condition has two phases: treating an acute attack and preventing future attacks. Acute gout treatment may include medicines to reduce pain and swelling, including:  NSAIDs.  Steroids. These are strong anti-inflammatory medicines that can be taken by mouth (orally) or injected into a joint.  Colchicine. This medicine relieves pain and swelling when it is taken soon after an attack. It can be given by mouth or through an IV. Preventive treatment may include:  Daily use of smaller doses of NSAIDs or colchicine.  Use of a medicine that reduces uric acid levels in your blood.  Changes to your diet. You may   need to see a dietitian about what to eat and drink to prevent gout. Follow these instructions at home: During a gout attack   If directed, put ice on the affected area: ? Put ice in a plastic bag. ? Place a towel between your skin and the bag. ? Leave the ice on for 20 minutes, 2-3 times a day.  Raise (elevate) the affected joint above the level of your heart as often as possible.  Rest the joint as much as possible.  If the affected joint is in your leg, you may be given crutches to use.  Follow instructions from your health care provider about eating or drinking restrictions. Avoiding future gout attacks  Follow a low-purine diet as told by your dietitian or health care provider. Avoid foods and drinks that are high in purines, including liver, kidney, anchovies, asparagus, herring, mushrooms, mussels, and beer.  Maintain a healthy weight or lose weight if you are overweight. If you want to lose weight, talk with your health care provider. It is important that you do not lose weight too quickly.  Start or maintain an exercise program as told by your health care provider. Eating and drinking  Drink enough fluids to keep your urine pale yellow.  If you drink alcohol: ? Limit how much you use to:  0-1 drink a day for women.  0-2 drinks a day for men. ? Be aware of how much alcohol is in your drink. In the [place] one drink equals one 12 oz bottle of beer (355 mL) one 5 oz glass of wine (148 mL), or one 1 oz glass of hard liquor (44 mL). General instructions  Take over-the-counter and prescription medicines only as told by your health care provider.  Do not drive or use heavy machinery while taking prescription pain medicine.  Return to your normal activities as told by your health care provider. Ask your health care provider what activities are safe for you.  Keep all follow-up visits as told by your health care provider. This is important. Contact a health care provider if you have:  Another gout attack.  Continuing symptoms of a gout attack after 10 days of treatment.  Side effects from your medicines.  Chills or a fever.  Burning pain when you urinate.  Pain in your lower back or belly. Get help right away if you:  Have severe or uncontrolled pain.  Cannot urinate. Summary  Gout is painful swelling of the joints caused by inflammation.  The most common site of pain is the big  toe, but it can affect other joints in the body.  Medicines and dietary changes can help to prevent and treat gout attacks. This information is not intended to replace advice given to you by your health care provider. Make sure you discuss any questions you have with your health care provider. Document Released: 06/26/2000 Document Revised: 01/19/2018 Document Reviewed: 01/19/2018 Elsevier Patient Education  2020 [employer]  

## 2019-01-19 ENCOUNTER — Encounter: Payer: Self-pay | Admitting: Internal Medicine

## 2019-01-19 DIAGNOSIS — N401 Enlarged prostate with lower urinary tract symptoms: Secondary | ICD-10-CM | POA: Insufficient documentation

## 2019-01-19 DIAGNOSIS — K7010 Alcoholic hepatitis without ascites: Secondary | ICD-10-CM | POA: Insufficient documentation

## 2019-01-19 DIAGNOSIS — E785 Hyperlipidemia, unspecified: Secondary | ICD-10-CM | POA: Insufficient documentation

## 2019-01-19 DIAGNOSIS — Z1211 Encounter for screening for malignant neoplasm of colon: Secondary | ICD-10-CM | POA: Insufficient documentation

## 2019-01-19 DIAGNOSIS — R3916 Straining to void: Secondary | ICD-10-CM | POA: Insufficient documentation

## 2019-01-19 LAB — HIV ANTIBODY (ROUTINE TESTING W REFLEX): HIV 1&2 Ab, 4th Generation: NONREACTIVE

## 2019-01-19 MED ORDER — ALFUZOSIN HCL ER 10 MG PO TB24: 10.0000 mg | ORAL_TABLET | Freq: Every day | ORAL | 1 refills | Status: DC

## 2019-01-19 MED ORDER — FINASTERIDE 5 MG PO TABS: 5.0000 mg | ORAL_TABLET | Freq: Every day | ORAL | 1 refills | Status: DC

## 2019-02-27 LAB — COLOGUARD: Cologuard: NEGATIVE

## 2019-03-03 ENCOUNTER — Encounter: Payer: Self-pay | Admitting: Internal Medicine

## 2019-05-08 ENCOUNTER — Other Ambulatory Visit: Payer: Self-pay

## 2019-05-08 ENCOUNTER — Encounter: Payer: Self-pay | Admitting: Internal Medicine

## 2019-05-08 ENCOUNTER — Ambulatory Visit (INDEPENDENT_AMBULATORY_CARE_PROVIDER_SITE_OTHER)
Admission: RE | Admit: 2019-05-08 | Discharge: 2019-05-08 | Disposition: A | Discharge status: Home / Self Care | Payer: 59 | Source: Ambulatory Visit | Attending: Internal Medicine | Admitting: Internal Medicine

## 2019-05-08 ENCOUNTER — Ambulatory Visit (INDEPENDENT_AMBULATORY_CARE_PROVIDER_SITE_OTHER): Payer: 59 | Admitting: Internal Medicine

## 2019-05-08 ENCOUNTER — Other Ambulatory Visit (INDEPENDENT_AMBULATORY_CARE_PROVIDER_SITE_OTHER): Payer: 59

## 2019-05-08 VITALS — BP 120/80 | HR 54 | Temp 97.6°F | Resp 16 | Ht 72.0 in | Wt 181.0 lb

## 2019-05-08 DIAGNOSIS — M25571 Pain in right ankle and joints of right foot: Secondary | ICD-10-CM

## 2019-05-08 DIAGNOSIS — M7661 Achilles tendinitis, right leg: Secondary | ICD-10-CM | POA: Diagnosis not present

## 2019-05-08 DIAGNOSIS — M10072 Idiopathic gout, left ankle and foot: Secondary | ICD-10-CM

## 2019-05-08 DIAGNOSIS — R7989 Other specified abnormal findings of blood chemistry: Secondary | ICD-10-CM | POA: Diagnosis not present

## 2019-05-08 LAB — HEPATIC FUNCTION PANEL
ALT: 34 U/L (ref 0–53)
AST: 37 U/L (ref 0–37)
Albumin: 4.6 g/dL (ref 3.5–5.2)
Alkaline Phosphatase: 61 U/L (ref 39–117)
Bilirubin, Direct: 0.1 mg/dL (ref 0.0–0.3)
Total Bilirubin: 0.6 mg/dL (ref 0.2–1.2)
Total Protein: 7.2 g/dL (ref 6.0–8.3)

## 2019-05-08 LAB — URIC ACID: Uric Acid, Serum: 4.9 mg/dL (ref 4.0–7.8)

## 2019-05-08 MED ORDER — MELOXICAM 15 MG PO TABS: 15.0000 mg | ORAL_TABLET | Freq: Every day | ORAL | 0 refills | Status: DC

## 2019-05-08 NOTE — Progress Notes (Signed)
Subjective:  Patient ID: Gary Andrews, male    DOB: [birth year]  Age: 63 y.o. MRN: [id number]  CC: Ankle Pain   HPI Gary Andrews presents for f/up - He complains of a 1 week history of nontraumatic pain in his right ankle on the sides and posteriorly.  He feels a discomfort when he pushes off on the right foot.  He has gotten some symptom relief with icing it and taking ibuprofen.  Outpatient Medications Prior to Visit  Medication Sig Dispense Refill  . alfuzosin (UROXATRAL) 10 MG 24 hr tablet Take 1 tablet (10 mg total) by mouth daily with breakfast. 90 tablet 1  . allopurinol (ZYLOPRIM) 100 MG tablet Take 1 tablet (100 mg total) by mouth daily. 90 tablet 1  . aspirin 81 MG tablet Take 1 tablet (81 mg total) by mouth daily.    . carvedilol (COREG) 3.125 MG tablet Take 1 tablet (3.125 mg total) by mouth 2 (two) times daily with a meal. MUST SCHEDULE APPOINTMENT 60 tablet 0  . doxycycline (VIBRAMYCIN) 100 MG capsule TK 1 C PO QD    . ezetimibe (ZETIA) 10 MG tablet Take 1 tablet (10 mg total) by mouth daily. APPOINTMENT IS NEEDED 30 tablet 0  . finasteride (PROSCAR) 5 MG tablet Take 1 tablet (5 mg total) by mouth daily. 90 tablet 1  . lisinopril (ZESTRIL) 2.5 MG tablet Take 1 tablet (2.5 mg total) by mouth daily. APPOINTMENT IS NEEDED 30 tablet 0  . nitroGLYCERIN (NITROSTAT) 0.4 MG SL tablet Place 1 tablet (0.4 mg total) under the tongue every 5 (five) minutes as needed for chest pain (up to 3 doses). Please keep upcoming appt. 25 tablet 0  . rosuvastatin (CRESTOR) 20 MG tablet Take 1 tablet (20 mg total) by mouth daily. APPOINTMENT IS NEEDED 30 tablet 0  . Colchicine (GLOPERBA) 0.6 MG/5ML SOLN Take 5 mLs by mouth 2 (two) times a day. 300 mL 0   No facility-administered medications prior to visit.     ROS Review of Systems  Constitutional: Negative.  Negative for chills and fever.  HENT: Negative.   Eyes: Negative.   Respiratory: Negative for cough, chest tightness, shortness of breath and  wheezing.   Cardiovascular: Negative.  Negative for chest pain.  Gastrointestinal: Negative for abdominal pain, constipation, diarrhea, nausea and vomiting.  Genitourinary: Negative.  Negative for difficulty urinating.  Musculoskeletal: Positive for arthralgias. Negative for back pain and neck pain.  Skin: Negative.  Negative for color change and pallor.  Neurological: Negative.  Negative for dizziness.  Hematological: Negative for adenopathy. Does not bruise/bleed easily.  Psychiatric/Behavioral: Negative.     Objective:  BP 120/80 (BP Location: Left Arm, Patient Position: Sitting, Cuff Size: Normal)   Pulse (!) 54   Temp 97.6 F (36.4 C) (Oral)   Resp 16   Ht 6' (1.829 m)   Wt 181 lb (82.1 kg)   SpO2 96%   BMI 24.55 kg/m   BP Readings from Last 3 Encounters:  05/08/19 120/80  01/18/19 124/80  05/26/18 140/88    Wt Readings from Last 3 Encounters:  05/08/19 181 lb (82.1 kg)  01/18/19 183 lb (83 kg)  05/26/18 187 lb 4 oz (84.9 kg)    Physical Exam Vitals signs reviewed.  Constitutional:      Appearance: Normal appearance.  HENT:     Nose: Nose normal.     Mouth/Throat:     Mouth: Mucous membranes are moist.  Eyes:     General: No scleral icterus.  Conjunctiva/sclera: Conjunctivae normal.  Neck:     Musculoskeletal: Normal range of motion.  Cardiovascular:     Rate and Rhythm: Normal rate and regular rhythm.     Heart sounds: No murmur.  Pulmonary:     Effort: Pulmonary effort is normal.     Breath sounds: No stridor. No wheezing, rhonchi or rales.  Abdominal:     General: Abdomen is flat. Bowel sounds are normal. There is no distension.     Palpations: There is no hepatomegaly or splenomegaly.  Musculoskeletal: Normal range of motion.     Right ankle: Normal. He exhibits normal range of motion, no swelling and no deformity. No tenderness. Achilles tendon normal. Achilles tendon exhibits no pain, no defect and normal Thompson's test results.     Right lower  leg: No edema.     Left lower leg: No edema.  Lymphadenopathy:     Cervical: No cervical adenopathy.  Skin:    General: Skin is warm and dry.     Findings: No rash.  Neurological:     General: No focal deficit present.     Mental Status: He is alert.     Lab Results  Component Value Date   WBC 4.6 01/18/2019   HGB 15.2 01/18/2019   HCT 45.4 01/18/2019   PLT 189.0 01/18/2019   GLUCOSE 87 01/18/2019   CHOL 156 01/18/2019   TRIG 105.0 01/18/2019   HDL 50.20 01/18/2019   LDLDIRECT 167.3 07/26/2013   LDLCALC 85 01/18/2019   ALT 34 05/08/2019   AST 37 05/08/2019   NA 142 01/18/2019   K 4.9 01/18/2019   CL 105 01/18/2019   CREATININE 0.85 01/18/2019   BUN 15 01/18/2019   CO2 30 01/18/2019   TSH 1.86 01/18/2019   PSA 2.57 01/18/2019   INR 1.16 05/14/2012   HGBA1C 5.7 01/18/2019   Dg Ankle Complete Right  Result Date: 05/08/2019 CLINICAL DATA:  Right ankle pain 7 days.  No injury. EXAM: RIGHT ANKLE - COMPLETE 3+ VIEW COMPARISON:  None. FINDINGS: Ankle mortise is normal. There is no evidence of acute fracture or dislocation. Soft tissues are normal. IMPRESSION: No acute findings. Electronically Signed   By: Gary Andrews M.D.   On: 05/08/2019 16:13    Assessment & Plan:   Gary Andrews was seen today for ankle pain.  Diagnoses and all orders for this visit:  Acute idiopathic gout involving toe of left foot- The current ankle pain does not appear to be a gout flare.  He has achieved his uric acid goal with 100 mg of allopurinol once a day. -     Uric acid; Future  Elevated LFTs- His liver enzymes are better but still mildly elevated.  Screening for viral hepatitis is negative.  I remain concerned that this is alcoholic hepatitis.  I have asked him to get vaccinated against hepatitis A and B. -     Hepatitis B surface antigen; Future -     Hepatitis B surface antibody,quantitative; Future -     Hepatitis B core antibody, total; Future -     Hepatitis B surface  antibody,qualitative; Future -     Hepatitis C antibody; Future -     Hepatic function panel; Future  Acute right ankle pain- Based on his symptoms, exam, and normal plain films I think he has Achilles tendinitis. -     DG Ankle Complete Right; Future -     Ambulatory referral to Sports Medicine  Tendonitis, Achilles, right -  DG Ankle Complete Right; Future -     Ambulatory referral to Sports Medicine -     meloxicam (MOBIC) 15 MG tablet; Take 1 tablet (15 mg total) by mouth daily.   I have discontinued [employer]. I am also having him start on meloxicam. Additionally, I am having him maintain his aspirin, nitroGLYCERIN, allopurinol, ezetimibe, carvedilol, rosuvastatin, lisinopril, doxycycline, alfuzosin, and finasteride.  Meds ordered this encounter  Medications  . meloxicam (MOBIC) 15 MG tablet    Sig: Take 1 tablet (15 mg total) by mouth daily.    Dispense:  90 tablet    Refill:  0     Follow-up: Return in about 4 months (around 09/08/2019).  Scarlette Calico, MD

## 2019-05-08 NOTE — Patient Instructions (Signed)
Achilles Tendinitis  Achilles tendinitis is inflammation of the tough, cord-like band that attaches the lower leg muscles to the heel bone (Achilles tendon). This is usually caused by overusing the tendon and the ankle joint. Achilles tendinitis usually gets better over time with treatment and caring for yourself at home. It can take weeks or months to heal completely. What are the causes? This condition may be caused by:  A sudden increase in exercise or activity, such as running.  Doing the same exercises or activities (such as jumping) over and over.  Not warming up calf muscles before exercising.  Exercising in shoes that are worn out or not made for exercise.  Having arthritis or a bone growth (spur) on the back of the heel bone. This can rub against the tendon and hurt it.  Age-related wear and tear. Tendons become less flexible with age and more likely to be injured. What are the signs or symptoms? Common symptoms of this condition include:  Pain in the Achilles tendon or in the back of the leg, just above the heel. The pain usually gets worse with exercise.  Stiffness or soreness in the back of the leg, especially in the morning.  Swelling of the skin over the Achilles tendon.  Thickening of the tendon.  Bone spurs at the bottom of the Achilles tendon, near the heel.  Trouble standing on tiptoe. How is this diagnosed? This condition is diagnosed based on your symptoms and a physical exam. You may have tests, including:  X-rays.  MRI. How is this treated? The goal of treatment is to relieve symptoms and help your injury heal. Treatment may include:  Decreasing or stopping activities that caused the tendinitis. This may mean switching to low-impact exercises like biking or swimming.  Icing the injured area.  Doing physical therapy, including strengthening and stretching exercises.  NSAIDs to help relieve pain and swelling.  Using supportive shoes, wraps, heel  lifts, or a walking boot (air cast).  Surgery. This may be done if your symptoms do not improve after 6 months.  Using high-energy shock wave impulses to stimulate the healing process (extracorporeal shock wave therapy). This is rare.  Injection of medicines to help relieve inflammation (corticosteroids). This is rare. Follow these instructions at home: If you have an air cast:  Wear the cast as told by your health care provider. Remove it only as told by your health care provider.  Loosen the cast if your toes tingle, become numb, or turn cold and blue. Activity  Gradually return to your normal activities once your health care provider approves. Do not do activities that cause pain. ? Consider doing low-impact exercises, like cycling or swimming.  If you have an air cast, ask your health care provider when it is safe for you to drive.  If physical therapy was prescribed, do exercises as told by your health care provider or physical therapist. Managing pain, stiffness, and swelling   Raise (elevate) your foot above the level of your heart while you are sitting or lying down.  Move your toes often to avoid stiffness and to lessen swelling.  If directed, put ice on the injured area: ? Put ice in a plastic bag. ? Place a towel between your skin and the bag. ? Leave the ice on for 20 minutes, 2-3 times a day General instructions  If directed, wrap your foot with an elastic bandage or other wrap. This can help keep your tendon from moving too much while it   heals. Your health care provider will show you how to wrap your foot correctly.  Wear supportive shoes or heel lifts only as told by your health care provider.  Take over-the-counter and prescription medicines only as told by your health care provider.  Keep all follow-up visits as told by your health care provider. This is important. Contact a health care provider if:  You have symptoms that gets worse.  You have pain that  does not get better with medicine.  You develop new, unexplained symptoms.  You develop warmth and swelling in your foot.  You have a fever. Get help right away if:  You have a sudden popping sound or sensation in your Achilles tendon followed by severe pain.  You cannot move your toes or foot.  You cannot put any weight on your foot. Summary  Achilles tendinitis is inflammation of the tough, cord-like band that attaches the lower leg muscles to the heel bone (Achilles tendon).  This condition is usually caused by overusing the tendon and the ankle joint. It can also be caused by arthritis or normal aging.  The most common symptoms of this condition include pain, swelling, or stiffness in the Achilles tendon or in the back of the leg.  This condition is usually treated with rest, NSAIDs, and physical therapy. This information is not intended to replace advice given to you by your health care provider. Make sure you discuss any questions you have with your health care provider. Document Released: 04/08/2005 Document Revised: 06/11/2017 Document Reviewed: 05/18/2016 Elsevier Patient Education  2020 [employer]  

## 2019-05-09 ENCOUNTER — Encounter: Payer: Self-pay | Admitting: Internal Medicine

## 2019-05-09 LAB — HEPATITIS C ANTIBODY
Hepatitis C Ab: NONREACTIVE
SIGNAL TO CUT-OFF: 0.01 (ref <1.00)

## 2019-05-09 LAB — HEPATITIS B SURFACE ANTIBODY, QUANTITATIVE: Hep B S AB Quant (Post): <5 m[IU]/mL — ABNORMAL LOW (ref >=10)

## 2019-05-09 LAB — HEPATITIS B SURFACE ANTIGEN: Hepatitis B Surface Ag: NONREACTIVE

## 2019-05-09 LAB — HEPATITIS B CORE ANTIBODY, TOTAL: Hep B Core Total Ab: NONREACTIVE

## 2019-05-16 ENCOUNTER — Other Ambulatory Visit: Payer: Self-pay

## 2019-05-16 ENCOUNTER — Ambulatory Visit: Payer: Self-pay

## 2019-05-16 ENCOUNTER — Ambulatory Visit (INDEPENDENT_AMBULATORY_CARE_PROVIDER_SITE_OTHER): Payer: 59 | Admitting: Family Medicine

## 2019-05-16 ENCOUNTER — Encounter: Payer: Self-pay | Admitting: Family Medicine

## 2019-05-16 VITALS — BP 120/60 | HR 60 | Ht 72.0 in | Wt 188.0 lb

## 2019-05-16 DIAGNOSIS — M7751 Other enthesopathy of right foot: Secondary | ICD-10-CM

## 2019-05-16 DIAGNOSIS — M7750 Other enthesopathy of unspecified foot: Secondary | ICD-10-CM | POA: Insufficient documentation

## 2019-05-16 DIAGNOSIS — M25571 Pain in right ankle and joints of right foot: Secondary | ICD-10-CM

## 2019-05-16 DIAGNOSIS — G8929 Other chronic pain: Secondary | ICD-10-CM | POA: Diagnosis not present

## 2019-05-16 MED ORDER — PREDNISONE 50 MG PO TABS: ORAL_TABLET | ORAL | 0 refills | Status: DC

## 2019-05-16 NOTE — Progress Notes (Signed)
[facility] [place] [place], [place] [address] Phone: [phone] Subjective:   I Gary Andrews am serving as a Education administrator for Dr. Hulan Saas.  I'm seeing this patient by the request  of:  Janith Lima, MD   CC: Right ankle pain and swelling  [id number]  Gary Andrews is a 63 y.o. male coming in with complaint of right ankle pain. Loss of ROM.   Onset- chronic  Location - achilles   Character- sharp, dull, achy, sore, throbbing, tight  Aggravating factors- walking Reliving factors-  Therapies tried- ice, ibuprofen  Severity- 8 /10 at its worse      Past Medical History:  Diagnosis Date  . Atrial fibrillation ([place])    a. Noted at time of STEMI 05/2012, converted on amiodarone.  Marland Kitchen CAD (coronary artery disease)    a. 2012 s/p stenting x 2 (3 mos apart) [facility]. b. Acute ant-lat STEMI s/p DES to prox LAD 05/14/12, residual mod Cx dz for med rx - possible cardiac arrest (CPR).  . Cardiac arrest ([place])    a. 05/2012 - came in as bystander CPR, pulses back before EMS arrived at time of STEMI.  . Gout 2012  . HTN (hypertension)   . Hyperlipidemia   . Ischemic cardiomyopathy    a. EF 35% by cath at time of STEMI, improved to 50-55% at time of DC 05/2012.  . Transaminitis    a. At time of STEMI 05/2012 ?shock liver.   Past Surgical History:  Procedure Laterality Date  . CORONARY ANGIOPLASTY WITH STENT PLACEMENT  2013  . LEFT HEART CATHETERIZATION WITH CORONARY ANGIOGRAM N/A 05/14/2012   Procedure: LEFT HEART CATHETERIZATION WITH CORONARY ANGIOGRAM;  Surgeon: Sherren Mocha, MD;  Location[facility] CATH LAB;  Service: Cardiovascular;  Laterality: N/A;   Social History   Socioeconomic History  . Marital status: Married    Spouse name: Not on file  . Number of children: Not on file  . Years of education: Not on file  . Highest education level: Not on file  Occupational History  . Not on file  Social Needs  . Financial resource strain: Not  on file  . Food insecurity    Worry: Not on file    Inability: Not on file  . Transportation needs    Medical: Not on file    Non-medical: Not on file  Tobacco Use  . Smoking status: Never Smoker  . Smokeless tobacco: Never Used  Substance and Sexual Activity  . Alcohol use: Yes    Alcohol/week: 15.0 standard drinks    Types: 15 Cans of beer per week    Comment: occasional drink.  . Drug use: No  . Sexual activity: Not Currently  Lifestyle  . Physical activity    Days per week: Not on file    Minutes per session: Not on file  . Stress: Not on file  Relationships  . Social Herbalist on phone: Not on file    Gets together: Not on file    Attends religious service: Not on file    Active member of club or organization: Not on file    Attends meetings of clubs or organizations: Not on file    Relationship status: Not on file  Other Topics Concern  . Not on file  Social History Narrative  . Not on file   No Known Allergies Family History  Problem Relation Age of Onset  . CAD Father  alive  . Heart disease Father   . Heart attack Father   . Stroke Father   . CAD Brother        alive  . Heart disease Brother   . Heart disease Brother   . Alcohol abuse Mother   . Alcohol abuse Sister   . Cancer Neg Hx   . Diabetes Neg Hx   . Early death Neg Hx   . Hyperlipidemia Neg Hx   . Hypertension Neg Hx     Current Outpatient Medications (Endocrine & Metabolic):  .  predniSONE (DELTASONE) 50 MG tablet, 1 tablet by mouth daily  Current Outpatient Medications (Cardiovascular):  .  carvedilol (COREG) 3.125 MG tablet, Take 1 tablet (3.125 mg total) by mouth 2 (two) times daily with a meal. MUST SCHEDULE APPOINTMENT .  ezetimibe (ZETIA) 10 MG tablet, Take 1 tablet (10 mg total) by mouth daily. APPOINTMENT IS NEEDED .  lisinopril (ZESTRIL) 2.5 MG tablet, Take 1 tablet (2.5 mg total) by mouth daily. APPOINTMENT IS NEEDED .  nitroGLYCERIN (NITROSTAT) 0.4 MG SL  tablet, Place 1 tablet (0.4 mg total) under the tongue every 5 (five) minutes as needed for chest pain (up to 3 doses). Please keep upcoming appt. .  rosuvastatin (CRESTOR) 20 MG tablet, Take 1 tablet (20 mg total) by mouth daily. APPOINTMENT IS NEEDED   Current Outpatient Medications (Analgesics):  .  allopurinol (ZYLOPRIM) 100 MG tablet, Take 1 tablet (100 mg total) by mouth daily. Marland Kitchen  aspirin 81 MG tablet, Take 1 tablet (81 mg total) by mouth daily. .  meloxicam (MOBIC) 15 MG tablet, Take 1 tablet (15 mg total) by mouth daily.   Current Outpatient Medications (Other):  .  alfuzosin (UROXATRAL) 10 MG 24 hr tablet, Take 1 tablet (10 mg total) by mouth daily with breakfast. .  doxycycline (VIBRAMYCIN) 100 MG capsule, TK 1 C PO QD .  finasteride (PROSCAR) 5 MG tablet, Take 1 tablet (5 mg total) by mouth daily.    Past medical history, social, surgical and family history all reviewed in electronic medical record.  No pertanent information unless stated regarding to the chief complaint.   Review of Systems:  No headache, visual changes, nausea, vomiting, diarrhea, constipation, dizziness, abdominal pain, skin rash, fevers, chills, night sweats, weight loss, swollen lymph nodes, body aches, joint swelling,  chest pain, shortness of breath, mood changes.  Positive muscle aches  Objective  Blood pressure 120/60, pulse 60, height 6' (1.829 m), weight 188 lb (85.3 kg), SpO2 98 %.    General: No apparent distress alert and oriented x3 mood and affect normal, dressed appropriately.  HEENT: Pupils equal, extraocular movements intact  Respiratory: Patient's speak in full sentences and does not appear short of breath  Cardiovascular: No lower extremity edema, non tender, no erythema  Skin: Warm dry intact with no signs of infection or rash on extremities or on axial skeleton.  Abdomen: Soft nontender  Neuro: Cranial nerves II through XII are intact, neurovascularly intact in all extremities with 2+  DTRs and 2+ pulses.  Lymph: No lymphadenopathy of posterior or anterior cervical chain or axillae bilaterally.  Gait normal with good balance and coordination.  MSK:  Non tender with full range of motion and good stability and symmetric strength and tone of shoulders, elbows, wrist, hip, knee bilaterally.  Right ankle shows some pain in the retrocalcaneal and retro-Achilles bursitis areas.  Patient does have some tightness noted lacking the last 5 degrees of extension and flexion.  Tightness seems  to be more in the posterior and posterior lateral aspects of the ankle.  Negative Tinel's sign of the talar dome.  Neurovascularly intact distally.  Foot fairly unremarkable.  Limited musculoskeletal ultrasound was performed and interpreted by Lyndal Pulley  Limited ultrasound of patient's left ankle shows the patient does have what appears to be a rupture of the retrocalcaneal bursa as well as what appears to be potential gouty deposits.  No active tendinopathy noted the patient does have some superficial hypoechoic changes of the Achilles itself.  Very small calcaneal spur noted but otherwise unremarkable.  Patient does have a mild abnormality of the anterior talar dome noted Impression: Ruptured retrocalcaneal bursitis with questionable uric acid deposits  [id number]; 15 additional minutes spent for Therapeutic exercises as stated in above notes.  This included exercises focusing on stretching, strengthening, with significant focus on eccentric aspects.   Long term goals include an improvement in range of motion, strength, endurance as well as avoiding reinjury. Patient's frequency would include in 1-2 times a day, 3-5 times a week for a duration of 6-12 weeks. Ankle strengthening that included:  Basic range of motion exercises to allow proper full motion at ankle Stretching of the lower leg and hamstrings  Theraband exercises for the lower leg - inversion, eversion, dorsiflexion and plantarflexion each to be  completed with a theraband Balance exercises to increase proprioception Weight bearing exercises to increase strength and balance  Proper technique shown and discussed handout in great detail with ATC.  All questions were discussed and answered.     Impression and Recommendations:     This case required medical decision making of moderate complexity. The above documentation has been reviewed and is accurate and complete Lyndal Pulley, DO       Note: This dictation was prepared with Dragon dictation along with smaller phrase technology. Any transcriptional errors that result from this process are unintentional.

## 2019-05-16 NOTE — Patient Instructions (Signed)
Good to see you.  Ice 20 minutes 2 times daily. Usually after activity and before bed. Exercises 3 times a week.  Avoid being barefoot in the house hoka recovery sandals Tart cherry extract 1200mg  at night Heel lift 1/8th-1/16th of an inch Prednisone for 5 days See me again in 4-5 weeks

## 2019-05-16 NOTE — Assessment & Plan Note (Signed)
Retrocalcaneal bursitis likely.  Patient's Achilles tendon appears to be unremarkable.  Possible uric acid deposits within the ankle joint.  Questionable irritation to the anterior talar dome but patient is not having pain in this area.  Patient will be put on prednisone for 5 days, discussed over-the-counter natural supplementations to help with uric acid deposits.  Encourage patient to continue the allopurinol.  Discussed icing regimen his heel lift in his shoe short-term okay to do low impact exercises.  Follow-up with me again in 4 weeks worsening symptoms consider formal physical therapy or even advanced imaging to evaluate talar dome

## 2019-05-23 ENCOUNTER — Encounter: Payer: Self-pay | Admitting: Family Medicine

## 2019-05-23 DIAGNOSIS — M1000 Idiopathic gout, unspecified site: Secondary | ICD-10-CM

## 2019-05-23 MED ORDER — PREDNISONE 50 MG PO TABS: ORAL_TABLET | ORAL | 0 refills | Status: DC

## 2019-05-23 MED ORDER — ALLOPURINOL 100 MG PO TABS: 200.0000 mg | ORAL_TABLET | Freq: Every day | ORAL | 1 refills | Status: DC

## 2019-06-20 ENCOUNTER — Ambulatory Visit: Payer: 59 | Admitting: Family Medicine

## 2019-06-20 NOTE — Progress Notes (Deleted)
[facility] [place], [place] [address] Phone: [phone] Subjective:    I'm seeing this patient by the request  of:    This visit occurred during the SARS-CoV-2 public health emergency.  Safety protocols were in place, including screening questions prior to the visit, additional usage of staff PPE, and extensive cleaning of exam room while observing appropriate contact time as indicated for disinfecting solutions.     CC:   [id number]  Gary Andrews is a 63 y.o. male coming in with complaint of ***  Onset-  Location Duration-  Character- Aggravating factors- Reliving factors-  Therapies tried-  Severity-     Past Medical History:  Diagnosis Date  . Atrial fibrillation ([place])    a. Noted at time of STEMI 05/2012, converted on amiodarone.  Marland Kitchen CAD (coronary artery disease)    a. 2012 s/p stenting x 2 (3 mos apart) [facility] [place]. b. Acute ant-lat STEMI s/p DES to prox LAD 05/14/12, residual mod Cx dz for med rx - possible cardiac arrest (CPR).  . Cardiac arrest ([place])    a. 05/2012 - came in as bystander CPR, pulses back before EMS arrived at time of STEMI.  . Gout 2012  . HTN (hypertension)   . Hyperlipidemia   . Ischemic cardiomyopathy    a. EF 35% by cath at time of STEMI, improved to 50-55% at time of DC 05/2012.  . Transaminitis    a. At time of STEMI 05/2012 ?shock liver.   Past Surgical History:  Procedure Laterality Date  . CORONARY ANGIOPLASTY WITH STENT PLACEMENT  2013  . LEFT HEART CATHETERIZATION WITH CORONARY ANGIOGRAM N/A 05/14/2012   Procedure: LEFT HEART CATHETERIZATION WITH CORONARY ANGIOGRAM;  Surgeon: Sherren Mocha, MD;  Location: [facility] CATH LAB;  Service: Cardiovascular;  Laterality: N/A;   Social History   Socioeconomic History  . Marital status: Married    Spouse name: Not on file  . Number of children: Not on file  . Years of education: Not on file  . Highest education level: Not on file  Occupational  History  . Not on file  Social Needs  . Financial resource strain: Not on file  . Food insecurity    Worry: Not on file    Inability: Not on file  . Transportation needs    Medical: Not on file    Non-medical: Not on file  Tobacco Use  . Smoking status: Never Smoker  . Smokeless tobacco: Never Used  Substance and Sexual Activity  . Alcohol use: Yes    Alcohol/week: 15.0 standard drinks    Types: 15 Cans of beer per week    Comment: occasional drink.  . Drug use: No  . Sexual activity: Not Currently  Lifestyle  . Physical activity    Days per week: Not on file    Minutes per session: Not on file  . Stress: Not on file  Relationships  . Social Herbalist on phone: Not on file    Gets together: Not on file    Attends religious service: Not on file    Active member of club or organization: Not on file    Attends meetings of clubs or organizations: Not on file    Relationship status: Not on file  Other Topics Concern  . Not on file  Social History Narrative  . Not on file   No Known Allergies Family History  Problem Relation Age of Onset  . CAD  Father        alive  . Heart disease Father   . Heart attack Father   . Stroke Father   . CAD Brother        alive  . Heart disease Brother   . Heart disease Brother   . Alcohol abuse Mother   . Alcohol abuse Sister   . Cancer Neg Hx   . Diabetes Neg Hx   . Early death Neg Hx   . Hyperlipidemia Neg Hx   . Hypertension Neg Hx     Current Outpatient Medications (Endocrine & Metabolic):  .  predniSONE (DELTASONE) 50 MG tablet, 1 tablet by mouth daily  Current Outpatient Medications (Cardiovascular):  .  carvedilol (COREG) 3.125 MG tablet, Take 1 tablet (3.125 mg total) by mouth 2 (two) times daily with a meal. MUST SCHEDULE APPOINTMENT .  ezetimibe (ZETIA) 10 MG tablet, Take 1 tablet (10 mg total) by mouth daily. APPOINTMENT IS NEEDED .  lisinopril (ZESTRIL) 2.5 MG tablet, Take 1 tablet (2.5 mg total) by mouth  daily. APPOINTMENT IS NEEDED .  nitroGLYCERIN (NITROSTAT) 0.4 MG SL tablet, Place 1 tablet (0.4 mg total) under the tongue every 5 (five) minutes as needed for chest pain (up to 3 doses). Please keep upcoming appt. .  rosuvastatin (CRESTOR) 20 MG tablet, Take 1 tablet (20 mg total) by mouth daily. APPOINTMENT IS NEEDED   Current Outpatient Medications (Analgesics):  .  allopurinol (ZYLOPRIM) 100 MG tablet, Take 2 tablets (200 mg total) by mouth daily. Marland Kitchen  aspirin 81 MG tablet, Take 1 tablet (81 mg total) by mouth daily. .  meloxicam (MOBIC) 15 MG tablet, Take 1 tablet (15 mg total) by mouth daily.   Current Outpatient Medications (Other):  .  alfuzosin (UROXATRAL) 10 MG 24 hr tablet, Take 1 tablet (10 mg total) by mouth daily with breakfast. .  doxycycline (VIBRAMYCIN) 100 MG capsule, TK 1 C PO QD .  finasteride (PROSCAR) 5 MG tablet, Take 1 tablet (5 mg total) by mouth daily.    Past medical history, social, surgical and family history all reviewed in electronic medical record.  No pertanent information unless stated regarding to the chief complaint.   Review of Systems:  No headache, visual changes, nausea, vomiting, diarrhea, constipation, dizziness, abdominal pain, skin rash, fevers, chills, night sweats, weight loss, swollen lymph nodes, body aches, joint swelling, muscle aches, chest pain, shortness of breath, mood changes.   Objective  There were no vitals taken for this visit. Systems examined below as of    General: No apparent distress alert and oriented x3 mood and affect normal, dressed appropriately.  HEENT: Pupils equal, extraocular movements intact  Respiratory: Patient's speak in full sentences and does not appear short of breath  Cardiovascular: No lower extremity edema, non tender, no erythema  Skin: Warm dry intact with no signs of infection or rash on extremities or on axial skeleton.  Abdomen: Soft nontender  Neuro: Cranial nerves II through XII are intact,  neurovascularly intact in all extremities with 2+ DTRs and 2+ pulses.  Lymph: No lymphadenopathy of posterior or anterior cervical chain or axillae bilaterally.  Gait normal with good balance and coordination.  MSK:  Non tender with full range of motion and good stability and symmetric strength and tone of shoulders, elbows, wrist, hip, knee and ankles bilaterally.     Impression and Recommendations:     This case required medical decision making of moderate complexity. The above documentation has been reviewed and is  accurate and complete Lyndal Pulley, DO       Note: This dictation was prepared with Dragon dictation along with smaller phrase technology. Any transcriptional errors that result from this process are unintentional.

## 2019-07-17 ENCOUNTER — Encounter: Payer: Self-pay | Admitting: Internal Medicine

## 2019-08-01 ENCOUNTER — Other Ambulatory Visit: Payer: Self-pay | Admitting: Internal Medicine

## 2019-08-01 DIAGNOSIS — M7661 Achilles tendinitis, right leg: Secondary | ICD-10-CM

## 2019-10-17 ENCOUNTER — Encounter: Payer: Self-pay | Admitting: Cardiovascular Disease
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
## 2019-10-18 ENCOUNTER — Other Ambulatory Visit: Payer: Self-pay

## 2019-10-18 ENCOUNTER — Ambulatory Visit: Payer: 59 | Admitting: Cardiovascular Disease

## 2019-10-18 ENCOUNTER — Encounter: Payer: Self-pay | Admitting: Cardiovascular Disease

## 2019-10-18 VITALS — BP 88/46 | HR 54 | Ht 72.0 in | Wt 183.8 lb

## 2019-10-18 DIAGNOSIS — I2510 Atherosclerotic heart disease of native coronary artery without angina pectoris: Secondary | ICD-10-CM

## 2019-10-18 DIAGNOSIS — E785 Hyperlipidemia, unspecified: Secondary | ICD-10-CM | POA: Diagnosis not present

## 2019-10-18 NOTE — Patient Instructions (Addendum)
Medication Instructions:  Your physician has recommended you make the following change in your medication:  1-STOP Lisinopril   *If you need a refill on your cardiac medications before your next appointment, please call your pharmacy*  Lab Work: Your physician recommends that you have lab work today- Lipid panel, liver panel, and BMET  If you have labs (blood work) drawn today and your tests are completely normal, you will receive your results only by: Marland Kitchen MyChart Message (if you have MyChart) OR . A paper copy in the mail If you have any lab test that is abnormal or we need to change your treatment, we will call you to review the results.  Testing/Procedures: None ordered today.  Follow-Up: At [facility], you and your health needs are our priority.  As part of our continuing mission to provide you with exceptional heart care, we have created designated Provider Care Teams.  These Care Teams include your primary Cardiologist (physician) and Advanced Practice Providers (APPs -  Physician Assistants and Nurse Practitioners) who all work together to provide you with the care you need, when you need it.  We recommend signing up for the patient portal called "MyChart".  Sign up information is provided on this After Visit Summary.  MyChart is used to connect with patients for Virtual Visits (Telemedicine).  Patients are able to view lab/test results, encounter notes, upcoming appointments, etc.  Non-urgent messages can be sent to your provider as well.   To learn more about what you can do with MyChart, go to [email or web address].    Your next appointment:   12 month(s)  The format for your next appointment:   Either In Person or Virtual  Provider:   You may see Mertie Moores, MD or one of the following Advanced Practice Providers on your designated Care Team:    Truitt Merle, NP  Cecilie Kicks, NP  Kathyrn Drown, NP

## 2019-10-19 ENCOUNTER — Telehealth: Payer: Self-pay | Admitting: Cardiovascular Disease

## 2019-10-19 DIAGNOSIS — E785 Hyperlipidemia, unspecified: Secondary | ICD-10-CM

## 2019-10-19 DIAGNOSIS — I2510 Atherosclerotic heart disease of native coronary artery without angina pectoris: Secondary | ICD-10-CM

## 2019-10-19 LAB — LIPID PANEL
Chol/HDL Ratio: 3.5 ratio (ref 0.0–5.0)
Cholesterol, Total: 195 mg/dL (ref 100–199)
HDL: 55 mg/dL (ref >39)
LDL Chol Calc (NIH): 128 mg/dL — ABNORMAL HIGH (ref 0–99)
Triglycerides: 65 mg/dL (ref 0–149)
VLDL Cholesterol Cal: 12 mg/dL (ref 5–40)

## 2019-10-19 LAB — HEPATIC FUNCTION PANEL
ALT: 26 IU/L (ref 0–44)
AST: 33 IU/L (ref 0–40)
Albumin: 4.8 g/dL (ref 3.8–4.8)
Alkaline Phosphatase: 65 IU/L (ref 39–117)
Bilirubin Total: 0.5 mg/dL (ref 0.0–1.2)
Bilirubin, Direct: 0.13 mg/dL (ref 0.00–0.40)
Total Protein: 6.8 g/dL (ref 6.0–8.5)

## 2019-10-19 LAB — BASIC METABOLIC PANEL
BUN/Creatinine Ratio: 18 (ref 10–24)
BUN: 14 mg/dL (ref 8–27)
CO2: 25 mmol/L (ref 20–29)
Calcium: 9.4 mg/dL (ref 8.6–10.2)
Chloride: 104 mmol/L (ref 96–106)
Creatinine, Ser: 0.77 mg/dL (ref 0.76–1.27)
GFR calc Af Amer: 112 mL/min/{1.73_m2} (ref >59)
GFR calc non Af Amer: 97 mL/min/{1.73_m2} (ref >59)
Glucose: 91 mg/dL (ref 65–99)
Potassium: 4.7 mmol/L (ref 3.5–5.2)
Sodium: 142 mmol/L (ref 134–144)

## 2019-10-19 NOTE — Telephone Encounter (Signed)
Results reviewed with patient. He admits he has not been taking his ezetimibe and rosuvastatin consistently. He denies adverse effects from the medications and wants to take it consistently and recheck labs in 3 months. I scheduled his lab appointment for 7/8 and advised him to call back sooner with any concerns. He thanked me for the call.

## 2019-10-19 NOTE — Telephone Encounter (Signed)
New message  Patient is returning call for results. Please give patient a call back.

## 2019-10-21 ENCOUNTER — Telehealth: Payer: Self-pay

## 2019-10-21 NOTE — Telephone Encounter (Signed)
Per PCP pt is due for a follow up and PSA labs.   Can you call and see if he can schedule?

## 2019-10-30 NOTE — Telephone Encounter (Signed)
Unable to leave voicemail for patient to call back and schedule follow up and PSA labs.

## 2019-11-19 ENCOUNTER — Other Ambulatory Visit: Payer: Self-pay | Admitting: Physician Assistant

## 2019-11-19 DIAGNOSIS — I10 Essential (primary) hypertension: Secondary | ICD-10-CM

## 2020-01-18 ENCOUNTER — Other Ambulatory Visit: Payer: Self-pay

## 2020-01-18 ENCOUNTER — Other Ambulatory Visit: Payer: 59 | Admitting: *Deleted

## 2020-01-18 DIAGNOSIS — I2510 Atherosclerotic heart disease of native coronary artery without angina pectoris: Secondary | ICD-10-CM

## 2020-01-18 DIAGNOSIS — E785 Hyperlipidemia, unspecified: Secondary | ICD-10-CM

## 2020-01-18 DIAGNOSIS — I10 Essential (primary) hypertension: Secondary | ICD-10-CM

## 2020-01-18 LAB — LIPID PANEL
Chol/HDL Ratio: 4.1 ratio (ref 0.0–5.0)
Cholesterol, Total: 215 mg/dL — ABNORMAL HIGH (ref 100–199)
HDL: 52 mg/dL (ref >39)
LDL Chol Calc (NIH): 142 mg/dL — ABNORMAL HIGH (ref 0–99)
Triglycerides: 119 mg/dL (ref 0–149)
VLDL Cholesterol Cal: 21 mg/dL (ref 5–40)

## 2020-01-18 LAB — BASIC METABOLIC PANEL
BUN/Creatinine Ratio: 22 (ref 10–24)
BUN: 18 mg/dL (ref 8–27)
CO2: 23 mmol/L (ref 20–29)
Calcium: 9.6 mg/dL (ref 8.6–10.2)
Chloride: 102 mmol/L (ref 96–106)
Creatinine, Ser: 0.83 mg/dL (ref 0.76–1.27)
GFR calc Af Amer: 109 mL/min/{1.73_m2} (ref >59)
GFR calc non Af Amer: 94 mL/min/{1.73_m2} (ref >59)
Glucose: 96 mg/dL (ref 65–99)
Potassium: 4.5 mmol/L (ref 3.5–5.2)
Sodium: 137 mmol/L (ref 134–144)

## 2020-01-18 LAB — HEPATIC FUNCTION PANEL
ALT: 17 IU/L (ref 0–44)
AST: 29 IU/L (ref 0–40)
Albumin: 4.5 g/dL (ref 3.8–4.8)
Alkaline Phosphatase: 63 IU/L (ref 48–121)
Bilirubin Total: 0.5 mg/dL (ref 0.0–1.2)
Bilirubin, Direct: 0.13 mg/dL (ref 0.00–0.40)
Total Protein: 6.8 g/dL (ref 6.0–8.5)

## 2020-01-19 ENCOUNTER — Other Ambulatory Visit: Payer: Self-pay | Admitting: Nurse Practitioner

## 2020-01-19 DIAGNOSIS — E785 Hyperlipidemia, unspecified: Secondary | ICD-10-CM

## 2020-01-19 DIAGNOSIS — I2510 Atherosclerotic heart disease of native coronary artery without angina pectoris: Secondary | ICD-10-CM

## 2020-01-19 MED ORDER — ROSUVASTATIN CALCIUM 20 MG PO TABS: 20.0000 mg | ORAL_TABLET | Freq: Every day | ORAL | 3 refills | Status: DC

## 2020-04-15 ENCOUNTER — Other Ambulatory Visit: Payer: 59 | Admitting: *Deleted

## 2020-04-15 ENCOUNTER — Other Ambulatory Visit: Payer: Self-pay

## 2020-04-15 DIAGNOSIS — I10 Essential (primary) hypertension: Secondary | ICD-10-CM

## 2020-04-15 DIAGNOSIS — E785 Hyperlipidemia, unspecified: Secondary | ICD-10-CM

## 2020-04-15 LAB — HEPATIC FUNCTION PANEL
ALT: 33 IU/L (ref 0–44)
AST: 39 IU/L (ref 0–40)
Albumin: 4.6 g/dL (ref 3.8–4.8)
Alkaline Phosphatase: 64 IU/L (ref 44–121)
Bilirubin Total: 0.6 mg/dL (ref 0.0–1.2)
Bilirubin, Direct: 0.16 mg/dL (ref 0.00–0.40)
Total Protein: 6.7 g/dL (ref 6.0–8.5)

## 2020-04-15 LAB — BASIC METABOLIC PANEL
BUN/Creatinine Ratio: 19 (ref 10–24)
BUN: 16 mg/dL (ref 8–27)
CO2: 24 mmol/L (ref 20–29)
Calcium: 9.6 mg/dL (ref 8.6–10.2)
Chloride: 103 mmol/L (ref 96–106)
Creatinine, Ser: 0.84 mg/dL (ref 0.76–1.27)
GFR calc Af Amer: 108 mL/min/{1.73_m2} (ref >59)
GFR calc non Af Amer: 94 mL/min/{1.73_m2} (ref >59)
Glucose: 90 mg/dL (ref 65–99)
Potassium: 4.7 mmol/L (ref 3.5–5.2)
Sodium: 140 mmol/L (ref 134–144)

## 2020-04-15 LAB — LIPID PANEL
Chol/HDL Ratio: 2.8 ratio (ref 0.0–5.0)
Cholesterol, Total: 156 mg/dL (ref 100–199)
HDL: 56 mg/dL (ref >39)
LDL Chol Calc (NIH): 82 mg/dL (ref 0–99)
Triglycerides: 98 mg/dL (ref 0–149)
VLDL Cholesterol Cal: 18 mg/dL (ref 5–40)

## 2020-10-15 NOTE — Progress Notes (Signed)
Gary Andrews is a 65 y.o. male who presents to [place] at [facility] today for L elbow pain.  He was last seen by Dr. Tamala Julian on 05/16/19 for R ankle/Achille's pain.   Today, pt reports L elbow pain x several months w/ no known MOI.  He locates his pain to his L lateral epicondyle.  No significant injury or change in activity to explain pain.  Patient is right-hand dominant.  L elbow swelling: No Aggravating factors: gripping; picking up items w/ his L hand Treatments tried: tennis elbow brace; IBU   Pertinent review of systems: No fevers or chills  Relevant historical information: Heart disease.  Patient does have nitroglycerin sublingual tablets but has never used them in 10 years.   Exam:  BP 122/78 (BP Location: Left Arm, Patient Position: Sitting, Cuff Size: Normal)   Pulse 63   Ht 6' (1.829 m)   Wt 187 lb (84.8 kg)   SpO2 96%   BMI 25.36 kg/m  General: Well Developed, well nourished, and in no acute distress.   MSK: Left elbow slight swelling.  Appealing otherwise. Normal elbow motion. Tender palpation lateral epicondyle. Intact strength. Some pain at lateral elbow with resisted wrist extension and finger extension. Pulses cap refill and sensation are intact distally.    Lab and Radiology Results  X-ray images left elbow obtained today personally and independently interpreted No fractures or significant degenerative changes. Await formal radiology review  Diagnostic Limited MSK Ultrasound of: Left lateral elbow Slight hypoechoic change at common extensor tendon insertion site deep consistent with possible nonretracted partial tear.  No increased vascular activity on Doppler at this area. Otherwise lateral epicondyle area is normal.  Ultrasound examination. Impression: Lateral epicondylitis versus partial tear     Assessment and Plan: 65 y.o. male with left elbow lateral epicondylitis versus partial tear.  Continue conservative management  with counterforce brace.  Add nitroglycerin patch protocol and eccentric exercises.  Discussed safety of nitroglycerin patch.  Patient does have sublingual nitroglycerin tablets however he has never used them in 10 years and I think it is unlikely that he will need to use them now.  Believe that nitroglycerin patch is reasonably safe to use in the setting.  Plan to reassess in about 1 month.  If not improving could consider injection versus formal hand physical therapy or even MRI.   PDMP not reviewed this encounter. Orders Placed This Encounter  Procedures  . [place] LIMITED JOINT SPACE STRUCTURES UP LEFT(NO LINKED CHARGES)    Order Specific Question:   Reason for Exam (SYMPTOM  OR DIAGNOSIS REQUIRED)    Answer:   Left elbow pain    Order Specific Question:   Preferred imaging location?    Answer:   [facility]  . DG ELBOW COMPLETE LEFT (3+VIEW)    Standing Status:   Future    Number of Occurrences:   1    Standing Expiration Date:   11/15/2020    Order Specific Question:   Reason for Exam (SYMPTOM  OR DIAGNOSIS REQUIRED)    Answer:   L elbow pain    Order Specific Question:   Preferred imaging location?    Answer:   [facility]   Meds ordered this encounter  Medications  . nitroGLYCERIN (NITRODUR - DOSED IN MG/24 HR) 0.2 mg/hr patch    Sig: Apply 1/4 patch daily to tendon for tendonitis.    Dispense:  30 patch    Refill:  1  Discussed warning signs or symptoms. Please see discharge instructions. Patient expresses understanding.   The above documentation has been reviewed and is accurate and complete [name], M.D.

## 2020-10-16 ENCOUNTER — Ambulatory Visit: Payer: Self-pay

## 2020-10-16 ENCOUNTER — Encounter: Payer: Self-pay | Admitting: Family Medicine

## 2020-10-16 ENCOUNTER — Other Ambulatory Visit: Payer: Self-pay

## 2020-10-16 ENCOUNTER — Ambulatory Visit (INDEPENDENT_AMBULATORY_CARE_PROVIDER_SITE_OTHER): Payer: 59

## 2020-10-16 ENCOUNTER — Ambulatory Visit: Payer: 59 | Admitting: Family Medicine

## 2020-10-16 VITALS — BP 122/78 | HR 63 | Ht 72.0 in | Wt 187.0 lb

## 2020-10-16 DIAGNOSIS — M25522 Pain in left elbow: Secondary | ICD-10-CM | POA: Diagnosis not present

## 2020-10-16 DIAGNOSIS — M7712 Lateral epicondylitis, left elbow: Secondary | ICD-10-CM | POA: Diagnosis not present

## 2020-10-16 MED ORDER — NITROGLYCERIN 0.2 MG/HR TD PT24: MEDICATED_PATCH | TRANSDERMAL | 1 refills | Status: DC

## 2020-10-16 NOTE — Patient Instructions (Addendum)
Nice to meet you.  Please perform the exercise program that we have prepared for you and gone over in detail on a daily basis.  In addition to the handout you were provided you can access your program through: [email or web address]   Your unique program code is: [email or web address]  Recheck in about 1 month.   I recommend you obtained a compression sleeve to help with your joint problems. There are many options on the market however I recommend obtaining a elbow Body Helix compression sleeve.  You can find information (including how to appropriate measure yourself for sizing) can be found at www.Body http://www.lambert.com/.  Many of these products are health savings account (HSA) eligible.   You can use the compression sleeve at any time throughout the day but is most important to use while being active as well as for 2 hours post-activity.   It is appropriate to ice following activity with the compression sleeve in place.   Nitroglycerin Protocol   Apply 1/4 nitroglycerin patch to affected area daily.  Change position of patch within the affected area every 24 hours.  You may experience a headache during the first 1-2 weeks of using the patch, these should subside.  If you experience headaches after beginning nitroglycerin patch treatment, you may take your preferred over the counter pain reliever.  Another side effect of the nitroglycerin patch is skin irritation or rash related to patch adhesive.  Please notify our office if you develop more severe headaches or rash, and stop the patch.  Tendon healing with nitroglycerin patch may require 12 to 24 weeks depending on the extent of injury.  Men should not use if taking Viagra, Cialis, or Levitra.   Do not use if you have migraines or rosacea.    Please get an Xray today before you leave

## 2020-10-17 NOTE — Progress Notes (Signed)
Left elbow x-ray looks normal to radiology.

## 2020-11-12 NOTE — Progress Notes (Deleted)
   I, Wendy Poet, LAT, ATC, am serving as scribe for Dr. [name].  Karim Aiello is a 65 y.o. male who presents to [place] at [facility] today for f/u of L lateral epicondylitis.  He was last seen by Dr. Georgina Snell on 10/16/20 and was prescribed a HEP focusing on wrist extensor eccentrics and was prescribed nitroglycerin patches.  He was also advised to purchase an elbow compression sleeve.  Since his last visit, pt reports   Diagnostic testing: L elbow XR- 10/16/20  Pertinent review of systems: ***  Relevant historical information: ***   Exam:  There were no vitals taken for this visit. General: Well Developed, well nourished, and in no acute distress.   MSK: ***    Lab and Radiology Results No results found for this or any previous visit (from the past 72 hour(s)). No results found.     Assessment and Plan: 64 y.o. male with ***   PDMP not reviewed this encounter. No orders of the defined types were placed in this encounter.  No orders of the defined types were placed in this encounter.    Discussed warning signs or symptoms. Please see discharge instructions. Patient expresses understanding.   ***

## 2020-11-13 ENCOUNTER — Ambulatory Visit: Payer: 59 | Admitting: Family Medicine

## 2020-11-20 NOTE — Progress Notes (Signed)
I, Wendy Poet, LAT, ATC, am serving as scribe for Dr. [name].  Gary Andrews is a 65 y.o. male who presents to [place] at [facility] today for f/u of L lateral epicondylitis.  He was last seen by Dr. Georgina Snell on 10/16/20 and was prescribed nitroglycerin patches and provided w/ a wrist extensor eccentric strengthening protocol.  He was also advised to use an elbow compression sleeve.  Since his last visit, pt reports no improvement in elbow pain. Pt has been compliant w/ nitro patches and HEP. Pt notes pain is intermittent and increases when trying to pick up a heavy item. Pt has been wearing an elbow strap sometimes.   Diagnostic testing: L elbow XR- 10/16/20   Pertinent review of systems: No fevers or chills  Relevant historical information: CAD   Exam:  BP 108/68 (BP Location: Right Arm, Patient Position: Sitting, Cuff Size: Normal)   Pulse 76   Ht 6' (1.829 m)   Wt 183 lb 9.6 oz (83.3 kg)   SpO2 96%   BMI 24.90 kg/m  General: Well Developed, well nourished, and in no acute distress.   MSK: Left elbow normal-appearing tender palpation lateral epicondyle.  Normal elbow motion and strength.  Pain with resisted wrist extension.    Lab and Radiology Results  Procedure: Real-time Ultrasound Guided Injection of left lateral epicondyle and common extensor tendon insertion Device: Philips Affiniti 50G Images permanently stored and available for review in PACS Verbal informed consent obtained.  Discussed risks and benefits of procedure. Warned about infection bleeding damage to structures skin hypopigmentation and fat atrophy among others. Patient expresses understanding and agreement Time-out conducted.   Noted no overlying erythema, induration, or other signs of local infection.   Skin prepped in a sterile fashion.   Local anesthesia: Topical Ethyl chloride.   With sterile technique and under real time ultrasound guidance:  40 mg of Kenalog and 2 mL of Marcaine  injected into common extensor tendon insertion. Fluid seen entering the common extensor tendon insertion.   During injection a gap appeared at the insertion site indicating that there is a tear at the insertion site Completed without difficulty   Following the injection [name] felt lightheaded and dizzy.  He was laid down into a supine position and he became sweaty and vomited.  He was given a cup of water and after a few minutes felt much better.  Vital signs were normal.  He recovered fully was able to leave the clinic without any complaints. Pain immediately resolved suggesting accurate placement of the medication.   Advised to call if fevers/chills, erythema, induration, drainage, or persistent bleeding.   Images permanently stored and available for review in the ultrasound unit.  Impression: Technically successful ultrasound guided injection.    Assessment and Plan: 65 y.o. male with left elbow pain due to lateral epicondylitis.  Failing early conservative management strategies.  After discussion today patient wishes to proceed with steroid injection.  During the injection it became apparent that he has a tear at the common extensor tendon insertion site as a gap appeared during the infiltration of the steroid/Marcaine mixture. Additionally immediately following the injection Gary Andrews suffered a valgus reaction with feeling lightheaded and vomiting.  He never had a syncopal event and felt better after few minutes.  Plan for continued conservative management for 2 more months.  If not better neck step would probably be MRI for potential surgical planning or PRP planning.  Recommend prescribing antiemetics such as Zofran prior  to any future injection.   PDMP not reviewed this encounter. Orders Placed This Encounter  Procedures  . [place] LIMITED JOINT SPACE STRUCTURES UP LEFT(NO LINKED CHARGES)    Standing Status:   Future    Number of Occurrences:   1    Standing Expiration Date:   05/24/2021     Order Specific Question:   Reason for Exam (SYMPTOM  OR DIAGNOSIS REQUIRED)    Answer:   left elbow pain    Order Specific Question:   Preferred imaging location?    Answer:   [facility]   No orders of the defined types were placed in this encounter.    Discussed warning signs or symptoms. Please see discharge instructions. Patient expresses understanding.   The above documentation has been reviewed and is accurate and complete [name], M.D.

## 2020-11-21 ENCOUNTER — Other Ambulatory Visit: Payer: Self-pay

## 2020-11-21 ENCOUNTER — Ambulatory Visit: Payer: Self-pay

## 2020-11-21 ENCOUNTER — Ambulatory Visit: Payer: 59 | Admitting: Family Medicine

## 2020-11-21 VITALS — BP 108/68 | HR 76 | Ht 72.0 in | Wt 183.6 lb

## 2020-11-21 DIAGNOSIS — M25522 Pain in left elbow: Secondary | ICD-10-CM | POA: Diagnosis not present

## 2020-11-21 NOTE — Patient Instructions (Signed)
Thank you for coming in today.  Call or go to the ER if you develop a large red swollen joint with extreme pain or oozing puss.   Resume home exercises.   Return in 2 months ish.   Let me know if this is not going well.   Consider PRP or surgery in the future.

## 2021-01-17 NOTE — Progress Notes (Deleted)
   I, Wendy Poet, LAT, ATC, am serving as scribe for Dr. [name].  Markese Bloxham is a 65 y.o. male who presents to [place] at [facility] today for f/u of L elbow pain / lateral epicondylitis.  He was last seen by Dr. Georgina Snell on 11/21/20 and reported no change in his symptoms despite consistently wearing his nitroglycerin patches and doing his HEP.  He had a L lateral epicondyle injection at his last visit and was advised to resume his HEP.  Since his last visit, pt reports  Diagnostic testing: L elbow XR- 10/16/20  Pertinent review of systems: ***  Relevant historical information: ***   Exam:  There were no vitals taken for this visit. General: Well Developed, well nourished, and in no acute distress.   MSK: ***    Lab and Radiology Results No results found for this or any previous visit (from the past 72 hour(s)). No results found.     Assessment and Plan: 65 y.o. male with ***   PDMP not reviewed this encounter. No orders of the defined types were placed in this encounter.  No orders of the defined types were placed in this encounter.    Discussed warning signs or symptoms. Please see discharge instructions. Patient expresses understanding.   ***

## 2021-01-20 ENCOUNTER — Ambulatory Visit: Payer: 59 | Admitting: Family Medicine

## 2021-01-22 NOTE — Progress Notes (Signed)
   I, Wendy Poet, LAT, ATC, am serving as scribe for Dr. [name].  Gary Andrews is a 65 y.o. male who presents to [place] at [facility] today for f/u of L lateral epicondylitis.  He was last seen for f/u on 11/21/20 and noted no change in his symptoms.  He had been wearing his nitro patches and doing his HEP consistently.  He had a L lateral epicondyle injection at his last visit and was advised to con't his HEP.  Since his last visit, pt reports elbow pain has fully resolved and he has no pain. Pt has resumed all normal activities. Pt was curious to know if there was still a "tear" in his elbow.  Diagnostic imaging: L elbow XR- 10/16/20  Pertinent review of systems: No fevers or chills  Relevant historical information: History of vagus response to last injection.   Exam:  BP 102/76 (BP Location: Right Arm, Patient Position: Sitting, Cuff Size: Normal)   Pulse 64   Ht 6' (1.829 m)   Wt 184 lb (83.5 kg)   SpO2 99%   BMI 24.95 kg/m  General: Well Developed, well nourished, and in no acute distress.   MSK: Left elbow normal-appearing nontender normal motion normal strength.  No pain with resisted wrist or finger extension.    Lab and Radiology Results  Diagnostic Limited MSK Ultrasound of: Left elbow Lateral epicondyle visualized. Slight hyperechoic change deep to common extensor tendon origin. No definitive tear visible. No increased vascular activity. Impression: No definitive tear of the common extensor tendon.  No dramatic vascular ultrasound changes consistent with lateral epicondylitis      Assessment and Plan: 65 y.o. male with significantly improved left lateral epicondylitis status postinjection about 2 months ago.  Plan to continue home exercise program and watchful waiting.  Recheck as needed.   PDMP not reviewed this encounter. Orders Placed This Encounter  Procedures   [place] LIMITED JOINT SPACE STRUCTURES UP LEFT(NO LINKED CHARGES)     Standing Status:   Future    Number of Occurrences:   1    Standing Expiration Date:   07/26/2021    Order Specific Question:   Reason for Exam (SYMPTOM  OR DIAGNOSIS REQUIRED)    Answer:   left elbow pain    Order Specific Question:   Preferred imaging location?    Answer:   [place]   No orders of the defined types were placed in this encounter.    Discussed warning signs or symptoms. Please see discharge instructions. Patient expresses understanding.   The above documentation has been reviewed and is accurate and complete [name], M.D. Total encounter time 20 minutes including face-to-face time with the patient and, reviewing past medical record, and charting on the date of service.  Time excludes time to perform ultrasound. Treatment plan and options

## 2021-01-23 ENCOUNTER — Other Ambulatory Visit: Payer: Self-pay

## 2021-01-23 ENCOUNTER — Ambulatory Visit: Payer: Self-pay

## 2021-01-23 ENCOUNTER — Ambulatory Visit: Payer: 59 | Admitting: Family Medicine

## 2021-01-23 VITALS — BP 102/76 | HR 64 | Ht 72.0 in | Wt 184.0 lb

## 2021-01-23 DIAGNOSIS — M25522 Pain in left elbow: Secondary | ICD-10-CM

## 2021-01-23 DIAGNOSIS — M7712 Lateral epicondylitis, left elbow: Secondary | ICD-10-CM | POA: Insufficient documentation

## 2021-01-23 NOTE — Patient Instructions (Signed)
Thank you for coming in today.   Continue the exercises.   Return as needed.

## 2021-02-07 ENCOUNTER — Other Ambulatory Visit: Payer: Self-pay

## 2021-02-07 ENCOUNTER — Other Ambulatory Visit: Payer: Self-pay | Admitting: Cardiovascular Disease

## 2021-02-07 DIAGNOSIS — I10 Essential (primary) hypertension: Secondary | ICD-10-CM

## 2021-02-07 MED ORDER — EZETIMIBE 10 MG PO TABS: 10.0000 mg | ORAL_TABLET | Freq: Every day | ORAL | 0 refills | Status: DC

## 2021-02-10 ENCOUNTER — Other Ambulatory Visit: Payer: Self-pay

## 2021-02-10 MED ORDER — EZETIMIBE 10 MG PO TABS: 10.0000 mg | ORAL_TABLET | Freq: Every day | ORAL | 0 refills | Status: DC

## 2021-05-13 ENCOUNTER — Encounter: Payer: Self-pay | Admitting: Cardiovascular Disease
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
## 2021-05-14 ENCOUNTER — Encounter: Payer: Self-pay | Admitting: Cardiovascular Disease

## 2021-05-14 ENCOUNTER — Ambulatory Visit: Payer: 59 | Admitting: Cardiovascular Disease

## 2021-05-14 ENCOUNTER — Other Ambulatory Visit: Payer: Self-pay

## 2021-05-14 VITALS — BP 112/70 | HR 59 | Ht 72.0 in | Wt 187.0 lb

## 2021-05-14 DIAGNOSIS — E785 Hyperlipidemia, unspecified: Secondary | ICD-10-CM | POA: Diagnosis not present

## 2021-05-14 DIAGNOSIS — I10 Essential (primary) hypertension: Secondary | ICD-10-CM

## 2021-05-14 DIAGNOSIS — I2510 Atherosclerotic heart disease of native coronary artery without angina pectoris: Secondary | ICD-10-CM | POA: Diagnosis not present

## 2021-05-14 MED ORDER — EZETIMIBE 10 MG PO TABS: 10.0000 mg | ORAL_TABLET | Freq: Every day | ORAL | 3 refills | Status: DC

## 2021-05-14 MED ORDER — CARVEDILOL 3.125 MG PO TABS: 3.1250 mg | ORAL_TABLET | Freq: Two times a day (BID) | ORAL | 3 refills | Status: DC

## 2021-05-14 MED ORDER — LISINOPRIL 2.5 MG PO TABS: 2.5000 mg | ORAL_TABLET | Freq: Every day | ORAL | 3 refills | Status: DC

## 2021-05-14 MED ORDER — ROSUVASTATIN CALCIUM 20 MG PO TABS: 20.0000 mg | ORAL_TABLET | Freq: Every day | ORAL | 3 refills | Status: DC

## 2021-05-14 NOTE — Patient Instructions (Signed)
Medication Instructions:  1) START Carvedilol 3.125mg  twice daily  *If you need a refill on your cardiac medications before your next appointment, please call your pharmacy*   Lab Work: Lipid, ALT and BMET in 2 months.  You will need to be fasting for these labs (nothing to eat or drink after midnight except water and black coffee).  If you have labs (blood work) drawn today and your tests are completely normal, you will receive your results only by: [place] (if you have MyChart) OR A paper copy in the mail If you have any lab test that is abnormal or we need to change your treatment, we will call you to review the results.   Testing/Procedures: None   Follow-Up: At [facility], you and your health needs are our priority.  As part of our continuing mission to provide you with exceptional heart care, we have created designated Provider Care Teams.  These Care Teams include your primary Cardiologist (physician) and Advanced Practice Providers (APPs -  Physician Assistants and Nurse Practitioners) who all work together to provide you with the care you need, when you need it.  We recommend signing up for the patient portal called "MyChart".  Sign up information is provided on this After Visit Summary.  MyChart is used to connect with patients for Virtual Visits (Telemedicine).  Patients are able to view lab/test results, encounter notes, upcoming appointments, etc.  Non-urgent messages can be sent to your provider as well.   To learn more about what you can do with MyChart, go to [email or web address].    Your next appointment:   1 year(s)  The format for your next appointment:   In Person  Provider:   You may see Mertie Moores, MD or one of the following Advanced Practice Providers on your designated Care Team:   Richardson Dopp, PA-C Robbie Lis, [place]   Other Instructions

## 2021-06-07 ENCOUNTER — Ambulatory Visit (HOSPITAL_COMMUNITY)
Admission: EM | Admit: 2021-06-07 | Discharge: 2021-06-07 | Disposition: A | Discharge status: Home / Self Care | Payer: 59

## 2021-06-07 ENCOUNTER — Encounter (HOSPITAL_COMMUNITY): Payer: Self-pay

## 2021-06-07 ENCOUNTER — Other Ambulatory Visit: Payer: Self-pay

## 2021-06-07 DIAGNOSIS — M79632 Pain in left forearm: Secondary | ICD-10-CM

## 2021-06-07 DIAGNOSIS — S51812A Laceration without foreign body of left forearm, initial encounter: Secondary | ICD-10-CM

## 2021-06-07 NOTE — ED Provider Notes (Signed)
[place]   MRN: [id number] DOB: June 05, 1956  Subjective:   Gary Andrews is a 65 y.o. male presenting for suffering a left forearm laceration.  Patient lost his footing and fell against a metal railing on the ground.  Discussed the left outer portion of his forearm.  He cleaned the wound, use peroxide, covered it and came straight to our clinic.  Tdap is up-to-date.  No head injury, loss consciousness.  No current facility-administered medications for this encounter.  Current Outpatient Medications:    alfuzosin (UROXATRAL) 10 MG 24 hr tablet, Take 1 tablet (10 mg total) by mouth daily with breakfast. (Patient not taking: Reported on 05/14/2021), Disp: 90 tablet, Rfl: 1   allopurinol (ZYLOPRIM) 100 MG tablet, Take 2 tablets (200 mg total) by mouth daily. (Patient not taking: Reported on 05/14/2021), Disp: 60 tablet, Rfl: 1   aspirin 81 MG tablet, Take 1 tablet (81 mg total) by mouth daily., Disp: , Rfl:    carvedilol (COREG) 3.125 MG tablet, Take 1 tablet (3.125 mg total) by mouth 2 (two) times daily with a meal., Disp: 180 tablet, Rfl: 3   doxycycline (VIBRAMYCIN) 100 MG capsule, Take 100 mg by mouth daily., Disp: , Rfl:    ezetimibe (ZETIA) 10 MG tablet, Take 1 tablet (10 mg total) by mouth daily., Disp: 90 tablet, Rfl: 3   lisinopril (ZESTRIL) 2.5 MG tablet, Take 1 tablet (2.5 mg total) by mouth daily., Disp: 90 tablet, Rfl: 3   meloxicam (MOBIC) 15 MG tablet, TAKE 1 TABLET(15 MG) BY MOUTH DAILY (Patient not taking: Reported on 05/14/2021), Disp: 90 tablet, Rfl: 0   nitroGLYCERIN (NITRODUR - DOSED IN MG/24 HR) 0.2 mg/hr patch, Apply 1/4 patch daily to tendon for tendonitis. (Patient not taking: Reported on 05/14/2021), Disp: 30 patch, Rfl: 1   nitroGLYCERIN (NITROSTAT) 0.4 MG SL tablet, Place 1 tablet (0.4 mg total) under the tongue every 5 (five) minutes as needed for chest pain (up to 3 doses). Please keep upcoming appt. (Patient not taking: Reported on 05/14/2021), Disp: 25  tablet, Rfl: 0   rosuvastatin (CRESTOR) 20 MG tablet, Take 1 tablet (20 mg total) by mouth daily., Disp: 90 tablet, Rfl: 3   No Known Allergies  Past Medical History:  Diagnosis Date   Atrial fibrillation ([place])    a. Noted at time of STEMI 05/2012, converted on amiodarone.   CAD (coronary artery disease)    a. 2012 s/p stenting x 2 (3 mos apart) [place]. b. Acute ant-lat STEMI s/p DES to prox LAD 05/14/12, residual mod Cx dz for med rx - possible cardiac arrest (CPR).   Cardiac arrest ([place])    a. 05/2012 - came in as bystander CPR, pulses back before EMS arrived at time of STEMI.   Gout 2012   HTN (hypertension)    Hyperlipidemia    Ischemic cardiomyopathy    a. EF 35% by cath at time of STEMI, improved to 50-55% at time of DC 05/2012.   Transaminitis    a. At time of STEMI 05/2012 ?shock liver.     Past Surgical History:  Procedure Laterality Date   CORONARY ANGIOPLASTY WITH STENT PLACEMENT  2013   LEFT HEART CATHETERIZATION WITH CORONARY ANGIOGRAM N/A 05/14/2012   Procedure: LEFT HEART CATHETERIZATION WITH CORONARY ANGIOGRAM;  Surgeon: Sherren Mocha, MD;  Location: [facility] CATH LAB;  Service: Cardiovascular;  Laterality: N/A;    Family History  Problem Relation Age of Onset   CAD Father  alive   Heart disease Father    Heart attack Father    Stroke Father    CAD Brother        alive   Heart disease Brother    Heart disease Brother    Alcohol abuse Mother    Alcohol abuse Sister    Cancer Neg Hx    Diabetes Neg Hx    Early death Neg Hx    Hyperlipidemia Neg Hx    Hypertension Neg Hx     Social History   Tobacco Use   Smoking status: Never   Smokeless tobacco: Never  Substance Use Topics   Alcohol use: Yes    Alcohol/week: 15.0 standard drinks    Types: 15 Cans of beer per week    Comment: occasional drink.   Drug use: No    ROS   Objective:   Vitals: BP 135/87 (BP Location: Left Arm)   Pulse 61   Temp 98 F (36.7 C) (Oral)   Resp 18   SpO2  97%   Physical Exam Constitutional:      General: He is not in acute distress.    Appearance: Normal appearance. He is well-developed and normal weight. He is not ill-appearing, toxic-appearing or diaphoretic.  HENT:     Head: Normocephalic and atraumatic.     Right Ear: External ear normal.     Left Ear: External ear normal.     Nose: Nose normal.     Mouth/Throat:     Pharynx: Oropharynx is clear.  Eyes:     General: No scleral icterus.       Right eye: No discharge.        Left eye: No discharge.     Extraocular Movements: Extraocular movements intact.     Pupils: Pupils are equal, round, and reactive to light.  Cardiovascular:     Rate and Rhythm: Normal rate.  Pulmonary:     Effort: Pulmonary effort is normal.  Musculoskeletal:       Arms:     Cervical back: Normal range of motion.  Neurological:     Mental Status: He is alert and oriented to person, place, and time.  Psychiatric:        Mood and Affect: Mood normal.        Behavior: Behavior normal.        Thought Content: Thought content normal.        Judgment: Judgment normal.    PROCEDURE NOTE: laceration repair Verbal consent obtained from patient.  Local anesthesia with 4cc Lidocaine 1% with epinephrine.  Wound explored for tendon, ligament damage. Wound scrubbed with soap and water and rinsed. Wound closed with #4 4-0 Prolene (simple interrupted) sutures.  Wound cleansed and dressed.   Assessment and Plan :   PDMP not reviewed this encounter.  1. Left forearm pain   2. Laceration of left forearm, initial encounter    Laceration repaired successfully. Wound care reviewed. Recommended Tylenol and/or ibuprofen for pain control. Return-to-clinic precautions discussed, patient verbalized understanding. Otherwise, follow up in 10 days for suture removal. Counseled patient on potential for adverse effects with medications prescribed/recommended today, ER and return-to-clinic precautions discussed, patient  verbalized understanding.    Jaynee Eagles, [place] 06/07/21 [phone]

## 2021-06-07 NOTE — ED Triage Notes (Signed)
Pt presents with laceration to left elbow from gardening rail today.

## 2021-06-07 NOTE — Discharge Instructions (Signed)

## 2021-06-18 ENCOUNTER — Other Ambulatory Visit: Payer: Self-pay

## 2021-06-18 ENCOUNTER — Encounter (HOSPITAL_COMMUNITY): Payer: Self-pay

## 2021-06-18 ENCOUNTER — Ambulatory Visit (HOSPITAL_COMMUNITY)
Admission: RE | Admit: 2021-06-18 | Discharge: 2021-06-18 | Disposition: A | Discharge status: Home / Self Care | Payer: 59 | Source: Ambulatory Visit | Attending: Internal Medicine | Admitting: Internal Medicine

## 2021-06-18 NOTE — ED Notes (Signed)
4 suture removed from left forearm. Wound clean, dry, well healed; pt tolerated procedure.

## 2021-06-18 NOTE — ED Triage Notes (Signed)
Pt presents for suture removal in left forearm. Denies pain, fever, chills, drainage.

## 2021-07-23 ENCOUNTER — Other Ambulatory Visit: Payer: Commercial Managed Care - PPO | Admitting: *Deleted

## 2021-07-23 ENCOUNTER — Other Ambulatory Visit: Payer: Self-pay

## 2021-07-23 DIAGNOSIS — E785 Hyperlipidemia, unspecified: Secondary | ICD-10-CM

## 2021-07-23 LAB — BASIC METABOLIC PANEL
BUN/Creatinine Ratio: 20 (ref 10–24)
BUN: 17 mg/dL (ref 8–27)
CO2: 27 mmol/L (ref 20–29)
Calcium: 9.3 mg/dL (ref 8.6–10.2)
Chloride: 105 mmol/L (ref 96–106)
Creatinine, Ser: 0.83 mg/dL (ref 0.76–1.27)
Glucose: 96 mg/dL (ref 70–99)
Potassium: 4.6 mmol/L (ref 3.5–5.2)
Sodium: 141 mmol/L (ref 134–144)
eGFR: 98 mL/min/{1.73_m2} (ref >59)

## 2021-07-23 LAB — LIPID PANEL
Chol/HDL Ratio: 2.7 ratio (ref 0.0–5.0)
Cholesterol, Total: 136 mg/dL (ref 100–199)
HDL: 51 mg/dL (ref >39)
LDL Chol Calc (NIH): 71 mg/dL (ref 0–99)
Triglycerides: 70 mg/dL (ref 0–149)
VLDL Cholesterol Cal: 14 mg/dL (ref 5–40)

## 2021-07-23 LAB — ALT: ALT: 37 IU/L (ref 0–44)

## 2021-07-29 ENCOUNTER — Telehealth: Payer: Self-pay

## 2021-07-29 DIAGNOSIS — E785 Hyperlipidemia, unspecified: Secondary | ICD-10-CM

## 2021-07-29 NOTE — Telephone Encounter (Signed)
Patient states he saw results in mychart, will send referral to lipid clinic.

## 2021-07-29 NOTE — Telephone Encounter (Signed)
-----   Message from Thayer Headings, MD sent at 07/23/2021  7:02 PM EST ----- BMP is stable LDL is 71.   He is very close to goal ( LDL goal of 50-70) He is on an aggressive oral treatment plan Lets refer him to the lipid clinic for consideration of PCSK9i or Inclisiran to achieve a lower LDL

## 2021-08-18 ENCOUNTER — Ambulatory Visit: Payer: Commercial Managed Care - PPO | Admitting: Pharmacist

## 2021-08-18 ENCOUNTER — Other Ambulatory Visit: Payer: Self-pay

## 2021-08-18 DIAGNOSIS — I2510 Atherosclerotic heart disease of native coronary artery without angina pectoris: Secondary | ICD-10-CM | POA: Diagnosis not present

## 2021-08-18 DIAGNOSIS — E785 Hyperlipidemia, unspecified: Secondary | ICD-10-CM

## 2021-08-18 NOTE — Patient Instructions (Addendum)
Please come for fasting labs on 10/20/21- lab opens at 7:15 AM I will submit a prior authorization for Repatha or Praluent  Please call me at [phone] with any questions
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
## 2021-08-21 ENCOUNTER — Telehealth: Payer: Self-pay | Admitting: Pharmacist

## 2021-08-21 NOTE — Telephone Encounter (Signed)
PA approved through 08/21/22. Patient would like to wait until recheck to start.

## 2021-10-20 ENCOUNTER — Other Ambulatory Visit: Payer: Commercial Managed Care - PPO | Admitting: *Deleted

## 2021-10-20 DIAGNOSIS — I2510 Atherosclerotic heart disease of native coronary artery without angina pectoris: Secondary | ICD-10-CM

## 2021-10-21 ENCOUNTER — Encounter: Payer: Self-pay | Admitting: Cardiovascular Disease

## 2021-10-23 LAB — LIPID PANEL
Chol/HDL Ratio: 2.7 ratio (ref 0.0–5.0)
Cholesterol, Total: 138 mg/dL (ref 100–199)
HDL: 51 mg/dL (ref >39)
LDL Chol Calc (NIH): 72 mg/dL (ref 0–99)
Triglycerides: 78 mg/dL (ref 0–149)
VLDL Cholesterol Cal: 15 mg/dL (ref 5–40)

## 2021-10-23 LAB — APOLIPOPROTEIN B: Apolipoprotein B: 65 mg/dL (ref <90)

## 2022-01-29 ENCOUNTER — Ambulatory Visit (HOSPITAL_COMMUNITY)
Admission: EM | Admit: 2022-01-29 | Discharge: 2022-01-29 | Disposition: A | Discharge status: Home / Self Care | Payer: Commercial Managed Care - PPO | Attending: Family Medicine | Admitting: Family Medicine

## 2022-01-29 ENCOUNTER — Encounter (HOSPITAL_COMMUNITY): Payer: Self-pay | Admitting: Emergency Medicine

## 2022-01-29 DIAGNOSIS — H1033 Unspecified acute conjunctivitis, bilateral: Secondary | ICD-10-CM | POA: Diagnosis not present

## 2022-01-29 MED ORDER — OLOPATADINE HCL 0.2 % OP SOLN: 1.0000 [drp] | Freq: Every day | OPHTHALMIC | 0 refills | Status: DC

## 2022-01-29 MED ORDER — TOBRAMYCIN 0.3 % OP SOLN: 1.0000 [drp] | Freq: Four times a day (QID) | OPHTHALMIC | 0 refills | Status: DC

## 2022-01-29 NOTE — ED Provider Notes (Signed)
[facility]   [id number] 01/29/22 Arrival Time: 1006  ASSESSMENT & PLAN:  1. Acute conjunctivitis of both eyes, unspecified acute conjunctivitis type    Begin: Meds ordered this encounter  Medications   Olopatadine HCl 0.2 % SOLN    Sig: Apply 1 drop to eye daily.    Dispense:  2.5 mL    Refill:  0   tobramycin (TOBREX) 0.3 % ophthalmic solution    Sig: Place 1 drop into the right eye every 6 (six) hours.    Dispense:  5 mL    Refill:  0   Discussed the diagnosis and proper care of conjunctivitis.  Stressed household Nurse, mental health. Warm compress to eye(s). Local eye care discussed.  Reviewed expectations re: course of current medical issues. Questions answered. Outlined signs and symptoms indicating need for more acute intervention. Patient verbalized understanding. After Visit Summary given.   SUBJECTIVE:  Gary Andrews is a 66 y.o. male who presents with complaint of eye irritation; R initially, now feeling in L, gritty feeling. Abrupt onset; few days. Watery drainage. No recent illnesses. No specific eye pain. Visual changes: no. Contact lens use: no. Recent illness: no. Self treatment: none.   OBJECTIVE:  Vitals:   01/29/22 1122 01/29/22 1123  BP: 125/75   Pulse: (!) 52   Resp: 18   Temp: 97.9 F (36.6 C)   TempSrc: Oral   SpO2: 97%   Weight:  81.6 kg  Height:  6' (1.829 m)    General appearance: alert; no distress HEENT: [place]; AT; PERRLA; no restriction of the extraocular movements OS: without reported pain; with mild conjunctival injection; with watery drainage; without corneal opacities; without limbal flush; without periorbital swelling or erythema OD: without reported pain; with 2+ conjunctival injection; with watery drainage; without corneal opacities; without limbal flush; without periorbital swelling or erythema Neck: supple without LAD Lungs: unlabored respirations Skin: warm and dry Psychological: alert and cooperative; normal mood and  affect    No Known Allergies  Past Medical History:  Diagnosis Date   Atrial fibrillation ([place])    a. Noted at time of STEMI 05/2012, converted on amiodarone.   CAD (coronary artery disease)    a. 2012 s/p stenting x 2 (3 mos apart) [facility]. b. Acute ant-lat STEMI s/p DES to prox LAD 05/14/12, residual mod Cx dz for med rx - possible cardiac arrest (CPR).   Cardiac arrest ([place])    a. 05/2012 - came in as bystander CPR, pulses back before EMS arrived at time of STEMI.   Gout 2012   HTN (hypertension)    Hyperlipidemia    Ischemic cardiomyopathy    a. EF 35% by cath at time of STEMI, improved to 50-55% at time of DC 05/2012.   Transaminitis    a. At time of STEMI 05/2012 ?shock liver.   Social History   Socioeconomic History   Marital status: Married    Spouse name: Not on file   Number of children: Not on file   Years of education: Not on file   Highest education level: Not on file  Occupational History   Not on file  Tobacco Use   Smoking status: Never   Smokeless tobacco: Never  Substance and Sexual Activity   Alcohol use: Yes    Alcohol/week: 15.0 standard drinks of alcohol    Types: 15 Cans of beer per week    Comment: occasional drink.   Drug use: No   Sexual activity: Not Currently  Other Topics Concern  Not on file  Social History Narrative   Not on file   Social Determinants of Health   Financial Resource Strain: Not on file  Food Insecurity: Not on file  Transportation Needs: Not on file  Physical Activity: Not on file  Stress: Not on file  Social Connections: Not on file  Intimate Partner Violence: Not on file   Family History  Problem Relation Age of Onset   CAD Father        alive   Heart disease Father    Heart attack Father    Stroke Father    CAD Brother        alive   Heart disease Brother    Heart disease Brother    Alcohol abuse Mother    Alcohol abuse Sister    Cancer Neg Hx    Diabetes Neg Hx    Early death Neg Hx     Hyperlipidemia Neg Hx    Hypertension Neg Hx    Past Surgical History:  Procedure Laterality Date   CORONARY ANGIOPLASTY WITH STENT PLACEMENT  2013   LEFT HEART CATHETERIZATION WITH CORONARY ANGIOGRAM N/A 05/14/2012   Procedure: LEFT HEART CATHETERIZATION WITH CORONARY ANGIOGRAM;  Surgeon: Sherren Mocha, MD;  Location: [facility] CATH LAB;  Service: Cardiovascular;  Laterality: N/A;      Vanessa Kick, MD 01/29/22 1434

## 2022-01-29 NOTE — ED Triage Notes (Signed)
Patient c/o possible pink eye in right eye x 1 week.  The eye is red and watery, no injury to the eye.  The vision is somewhat blurry.  Patient has taken Tylenol for discomfort.

## 2022-02-07 ENCOUNTER — Ambulatory Visit (HOSPITAL_COMMUNITY)
Admission: EM | Admit: 2022-02-07 | Discharge: 2022-02-07 | Disposition: A | Discharge status: Home / Self Care | Payer: Commercial Managed Care - PPO | Attending: Physician Assistant | Admitting: Physician Assistant

## 2022-02-07 ENCOUNTER — Encounter (HOSPITAL_COMMUNITY): Payer: Self-pay

## 2022-02-07 ENCOUNTER — Ambulatory Visit (INDEPENDENT_AMBULATORY_CARE_PROVIDER_SITE_OTHER): Payer: Commercial Managed Care - PPO

## 2022-02-07 DIAGNOSIS — M25561 Pain in right knee: Secondary | ICD-10-CM | POA: Diagnosis not present

## 2022-02-07 DIAGNOSIS — S8991XA Unspecified injury of right lower leg, initial encounter: Secondary | ICD-10-CM | POA: Diagnosis not present

## 2022-02-07 DIAGNOSIS — M25461 Effusion, right knee: Secondary | ICD-10-CM | POA: Diagnosis not present

## 2022-02-07 NOTE — Discharge Instructions (Signed)
Your x-ray did not show any bony abnormalities.  I believe that the swelling is related to an effusion.  Typically we recommend treating this with RICE protocol (rest, ice, compression, elevation.  I do recommend that you follow-up with an orthopedic provider.  Please call to schedule an appointment.  If anything worsens you need to be seen immediately.

## 2022-02-07 NOTE — ED Triage Notes (Signed)
Pt states he hit his right knee on a piece of furniture yesterday. C/O pain and swelling to right knee.  States he iced it last pm and took a motrin this am with relief.

## 2022-02-07 NOTE — ED Provider Notes (Signed)
[facility]    CSN: [id number] Arrival date & time: 02/07/22  1308      History   Chief Complaint Chief Complaint  Patient presents with   Knee Pain    HPI Gary Andrews is a 66 y.o. male.   Patient presents today with a 1 day history of right knee swelling and pain.  Reports that he was helping to carry a very heavy desk down a flight of stairs when the person who was at the top of the piece of furniture lost their grip causing it to fall forward towards him.  He used his right knee to stop it.  He did not initially have pain and continues to have some discomfort which is rated 3/4 on a 0-10 pain scale, described as aching, worse over patella, no alleviating factors identified.  He has been able to ambulate unassisted as well as bike 50 miles today without restriction.  He has tried Tylenol, ice, compression, elevation.  Denies previous injury or surgery involving knee.    Past Medical History:  Diagnosis Date   Atrial fibrillation ([place])    a. Noted at time of STEMI 05/2012, converted on amiodarone.   CAD (coronary artery disease)    a. 2012 s/p stenting x 2 (3 mos apart) [facility]. b. Acute ant-lat STEMI s/p DES to prox LAD 05/14/12, residual mod Cx dz for med rx - possible cardiac arrest (CPR).   Cardiac arrest ([place])    a. 05/2012 - came in as bystander CPR, pulses back before EMS arrived at time of STEMI.   Gout 2012   HTN (hypertension)    Hyperlipidemia    Ischemic cardiomyopathy    a. EF 35% by cath at time of STEMI, improved to 50-55% at time of DC 05/2012.   Transaminitis    a. At time of STEMI 05/2012 ?shock liver.    Patient Active Problem List   Diagnosis Date Noted   Lateral epicondylitis of left elbow 01/23/2021   Retrocalcaneal bursitis 05/16/2019   Elevated LFTs 05/08/2019   Tendonitis, Achilles, right 05/08/2019   Acute right ankle pain 05/08/2019   Hyperlipidemia LDL goal <70 01/19/2019   Colon cancer screening 01/19/2019   Benign prostatic  hyperplasia (BPH) with straining on urination 74/06/8785   Alcoholic hepatitis without ascites 01/19/2019   BPH with urinary obstruction 09/09/2017   Hyperlipidemia 10/10/2012   CAD (coronary artery disease) 07/22/2012   Gout 07/22/2012   Routine general medical examination at a health care facility 07/22/2012   Other abnormal glucose 07/22/2012    Past Surgical History:  Procedure Laterality Date   CORONARY ANGIOPLASTY WITH STENT PLACEMENT  2013   LEFT HEART CATHETERIZATION WITH CORONARY ANGIOGRAM N/A 05/14/2012   Procedure: LEFT HEART CATHETERIZATION WITH CORONARY ANGIOGRAM;  Surgeon: Sherren Mocha, MD;  Location: [facility] CATH LAB;  Service: Cardiovascular;  Laterality: N/A;       Home Medications    Prior to Admission medications   Medication Sig Start Date End Date Taking? Authorizing Provider  alfuzosin (UROXATRAL) 10 MG 24 hr tablet Take 1 tablet (10 mg total) by mouth daily with breakfast. Patient not taking: Reported on 05/14/2021 01/19/19   Janith Lima, MD  allopurinol (ZYLOPRIM) 100 MG tablet Take 2 tablets (200 mg total) by mouth daily. Patient not taking: Reported on 05/14/2021 05/23/19   Lyndal Pulley, DO  aspirin 81 MG tablet Take 1 tablet (81 mg total) by mouth daily. 05/17/12   Dunn, Nedra Hai, PA-C  carvedilol (COREG)  3.125 MG tablet Take 1 tablet (3.125 mg total) by mouth 2 (two) times daily with a meal. 05/14/21   Nahser, Wonda Cheng, MD  doxycycline (VIBRAMYCIN) 100 MG capsule Take 100 mg by mouth daily. 04/28/21   [provider]  ezetimibe (ZETIA) 10 MG tablet Take 1 tablet (10 mg total) by mouth daily. 05/14/21   Nahser, Wonda Cheng, MD  lisinopril (ZESTRIL) 2.5 MG tablet Take 1 tablet (2.5 mg total) by mouth daily. 05/14/21   Nahser, Wonda Cheng, MD  meloxicam (MOBIC) 15 MG tablet TAKE 1 TABLET(15 MG) BY MOUTH DAILY Patient not taking: Reported on 05/14/2021 08/01/19   Janith Lima, MD  nitroGLYCERIN (NITRODUR - DOSED IN MG/24 HR) 0.2 mg/hr patch Apply 1/4 patch  daily to tendon for tendonitis. Patient not taking: Reported on 05/14/2021 10/16/20   Gregor Hams, MD  nitroGLYCERIN (NITROSTAT) 0.4 MG SL tablet Place 1 tablet (0.4 mg total) under the tongue every 5 (five) minutes as needed for chest pain (up to 3 doses). Please keep upcoming appt. Patient not taking: Reported on 05/14/2021 08/16/17   Nahser, Wonda Cheng, MD  Olopatadine HCl 0.2 % SOLN Apply 1 drop to eye daily. 01/29/22   Vanessa Kick, MD  rosuvastatin (CRESTOR) 20 MG tablet Take 1 tablet (20 mg total) by mouth daily. 05/14/21   Nahser, Wonda Cheng, MD  tobramycin (TOBREX) 0.3 % ophthalmic solution Place 1 drop into the right eye every 6 (six) hours. 01/29/22   Vanessa Kick, MD    Family History Family History  Problem Relation Age of Onset   CAD Father        alive   Heart disease Father    Heart attack Father    Stroke Father    CAD Brother        alive   Heart disease Brother    Heart disease Brother    Alcohol abuse Mother    Alcohol abuse Sister    Cancer Neg Hx    Diabetes Neg Hx    Early death Neg Hx    Hyperlipidemia Neg Hx    Hypertension Neg Hx     Social History Social History   Tobacco Use   Smoking status: Never   Smokeless tobacco: Never  Substance Use Topics   Alcohol use: Yes    Alcohol/week: 15.0 standard drinks of alcohol    Types: 15 Cans of beer per week    Comment: occasional drink.   Drug use: No     Allergies   Patient has no known allergies.   Review of Systems Review of Systems  Constitutional:  Positive for activity change. Negative for appetite change, fatigue and fever.  Musculoskeletal:  Positive for arthralgias and gait problem. Negative for joint swelling.  Skin:  Positive for wound.  Neurological:  Negative for dizziness, weakness, light-headedness, numbness and headaches.     Physical Exam Triage Vital Signs ED Triage Vitals  Enc Vitals Group     BP 02/07/22 1335 103/69     Pulse Rate 02/07/22 1335 (!) 53     Resp 02/07/22 1335  16     Temp 02/07/22 1335 98 F (36.7 C)     Temp Source 02/07/22 1335 Oral     SpO2 02/07/22 1335 96 %     Weight --      Height --      Head Circumference --      Peak Flow --      Pain Score 02/07/22 1336 2  Pain Loc --      Pain Edu? --      Excl. in [place]? --    No data found.  Updated Vital Signs BP 103/69 (BP Location: Right Arm)   Pulse (!) 53   Temp 98 F (36.7 C) (Oral)   Resp 16   SpO2 96%   Visual Acuity Right Eye Distance:   Left Eye Distance:   Bilateral Distance:    Right Eye Near:   Left Eye Near:    Bilateral Near:     Physical Exam Vitals reviewed.  Constitutional:      General: He is awake.     Appearance: Normal appearance. He is well-developed. He is not ill-appearing.     Comments: Very pleasant male appears stated age in no acute distress sitting comfortably in exam room  HENT:     Head: Normocephalic and atraumatic.  Cardiovascular:     Rate and Rhythm: Normal rate and regular rhythm.     Heart sounds: Normal heart sounds, S1 normal and S2 normal. No murmur heard. Pulmonary:     Effort: Pulmonary effort is normal.     Breath sounds: Normal breath sounds. No stridor. No wheezing, rhonchi or rales.     Comments: Clear to auscultation bilaterally Musculoskeletal:     Right knee: Swelling, deformity and bony tenderness present. Decreased range of motion. Tenderness present over the patellar tendon. No LCL laxity, MCL laxity, ACL laxity or PCL laxity.     Comments: Right knee: Moderate effusion noted.  Multiple abrasions noted over anterior right knee without evidence of infection.  Normal active range of motion.  No ligamentous laxity on exam.  Mild tenderness palpation over patella with possible deformity.  Neurological:     Mental Status: He is alert.  Psychiatric:        Behavior: Behavior is cooperative.      UC Treatments / Results  Labs (all labs ordered are listed, but only abnormal results are displayed) Labs Reviewed - No data  to display  EKG   Radiology DG Knee AP/LAT W/[place] Right  Result Date: 02/07/2022 CLINICAL DATA:  Pain after injury EXAM: RIGHT KNEE 3 VIEWS COMPARISON:  None Available. FINDINGS: Anterior soft tissue swelling at and just inferior to the patella. No fractures or dislocations. No joint effusions. IMPRESSION: Anterior soft tissue swelling.  No other abnormalities. Electronically Signed   By: Dorise Bullion III M.D.   On: 02/07/2022 14:47    Procedures Procedures (including critical care time)  Medications Ordered in UC Medications - No data to display  Initial Impression / Assessment and Plan / UC Course  I have reviewed the triage vital signs and the nursing notes.  Pertinent labs & imaging results that were available during my care of the patient were reviewed by me and considered in my medical decision making (see chart for details).     X-ray obtained given mechanism of injury showed no acute osseous abnormalities.  Recommended conservative treatment measures including RICE protocol.  Patient was encouraged to use Tylenol for pain relief.  Offered tetanus update given abrasion which he declined.  To discuss that he should keep this area clean and monitor for any signs of infection including fever, increased swelling, redness, increased pain.  Recommended that he follow-up with an orthopedic provider was given contact information for local provider with instruction to call to schedule an appointment.  If he has any worsening symptoms he is to return for reevaluation.  Strict return precautions given.  Final Clinical Impressions(s) / UC Diagnoses   Final diagnoses:  Injury of right knee, initial encounter  Knee effusion, right     Discharge Instructions      Your x-ray did not show any bony abnormalities.  I believe that the swelling is related to an effusion.  Typically we recommend treating this with RICE protocol (rest, ice, compression, elevation.  I do recommend that you  follow-up with an orthopedic provider.  Please call to schedule an appointment.  If anything worsens you need to be seen immediately.     ED Prescriptions   None    PDMP not reviewed this encounter.   Terrilee Croak, PA-C 02/07/22 1542

## 2022-08-04 ENCOUNTER — Other Ambulatory Visit: Payer: Self-pay | Admitting: Cardiovascular Disease

## 2022-08-05 ENCOUNTER — Other Ambulatory Visit: Payer: Self-pay | Admitting: *Deleted

## 2022-08-05 ENCOUNTER — Other Ambulatory Visit: Payer: Self-pay | Admitting: Cardiovascular Disease

## 2022-08-05 MED ORDER — ROSUVASTATIN CALCIUM 20 MG PO TABS: 20.0000 mg | ORAL_TABLET | Freq: Every day | ORAL | 0 refills | Status: DC

## 2022-08-05 MED ORDER — EZETIMIBE 10 MG PO TABS: 10.0000 mg | ORAL_TABLET | Freq: Every day | ORAL | 0 refills | Status: DC

## 2022-08-22 IMAGING — DX DG ELBOW COMPLETE 3+V*L*
4 series · 4 of 4 positions shown · non-contrast
Comparison: None.

CLINICAL DATA: Left elbow pain for 3 months without known injury.

EXAM:
LEFT ELBOW - COMPLETE 3+ VIEW

[elbow ap]
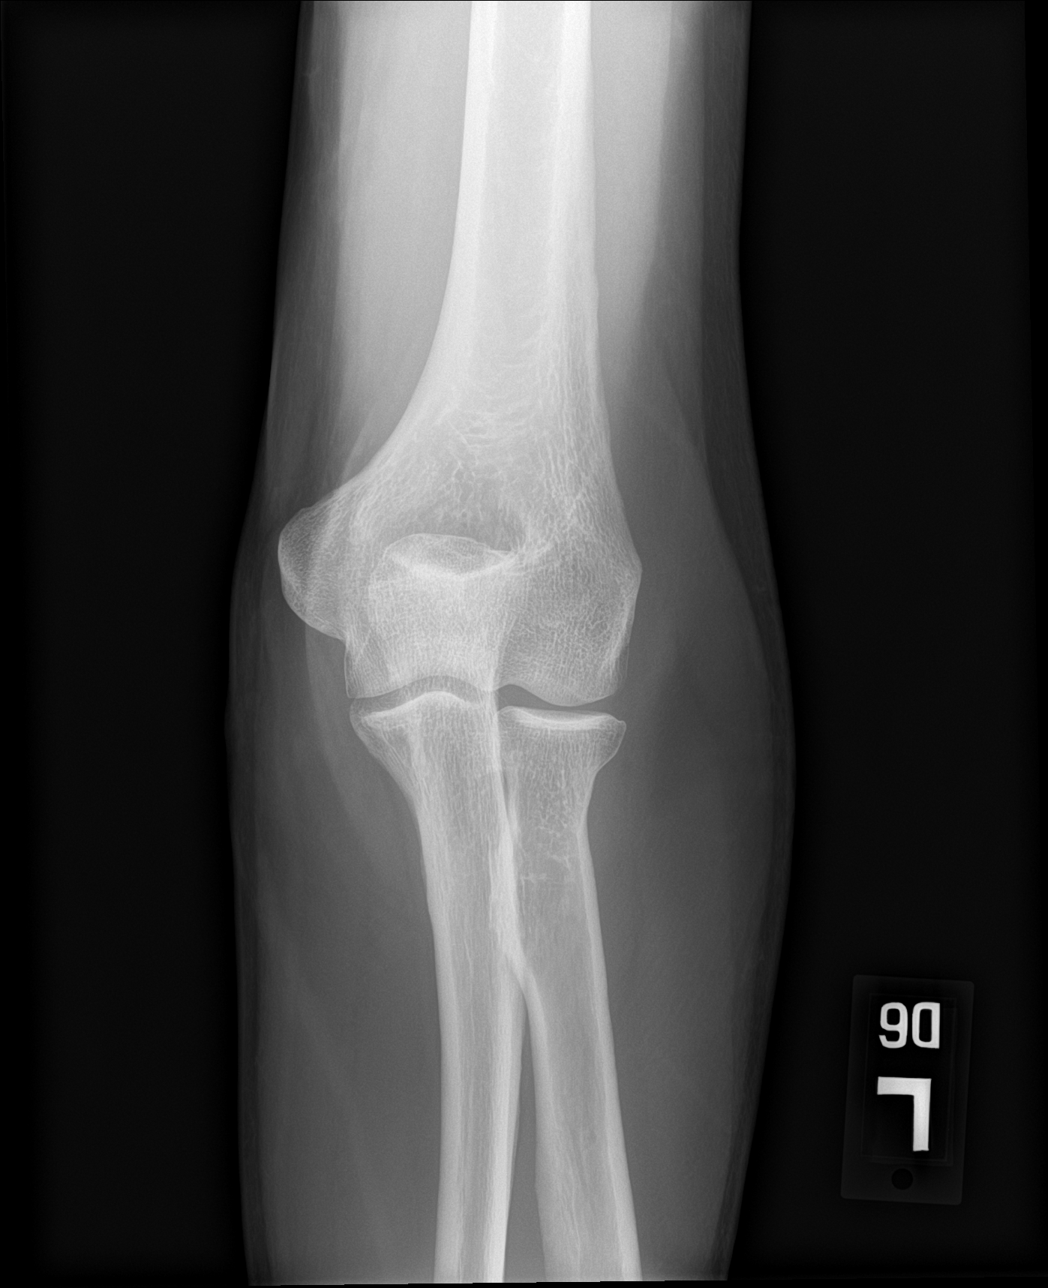

[elbow obl (1 of 2)]
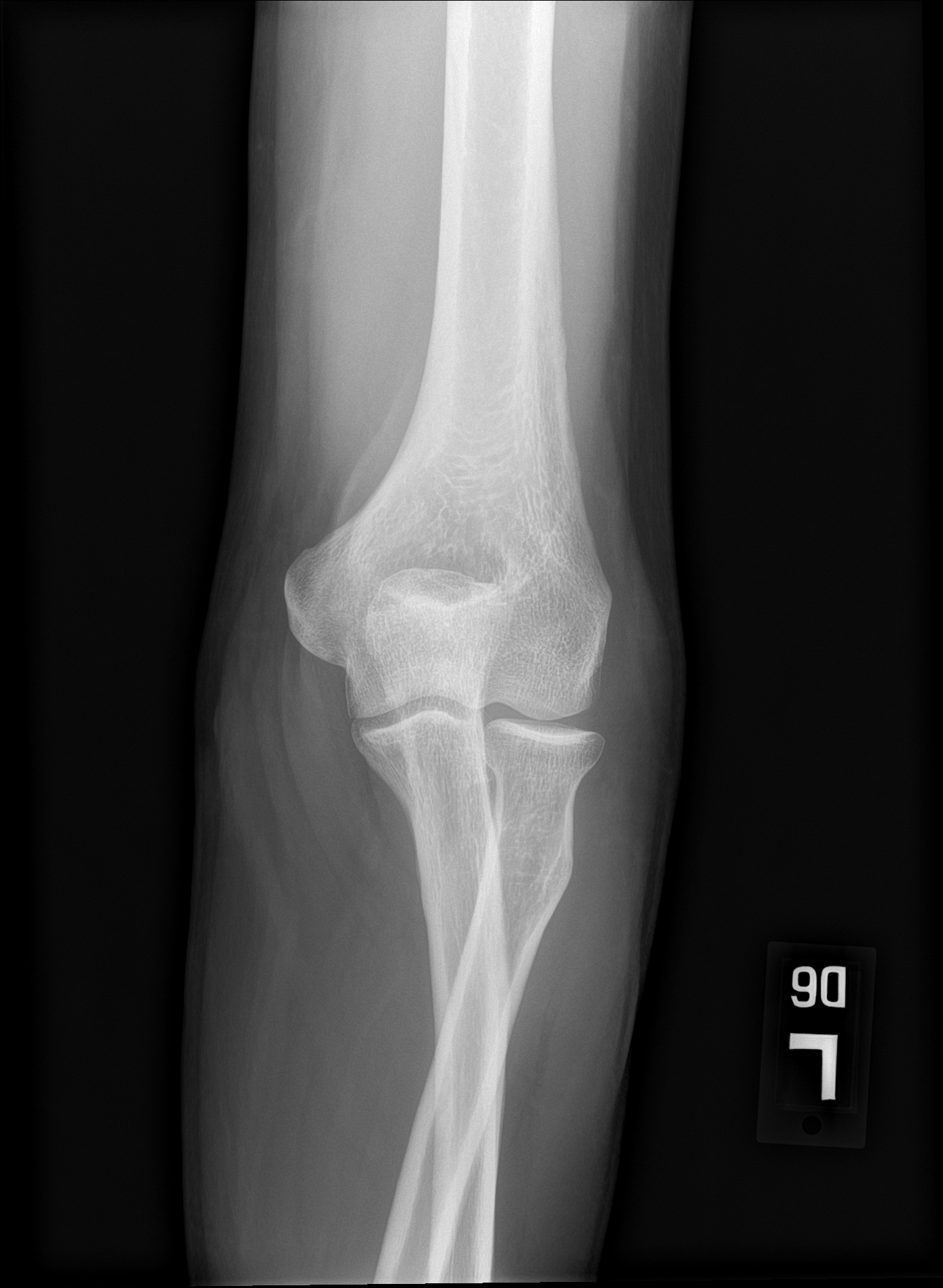

[elbow obl (2 of 2)]
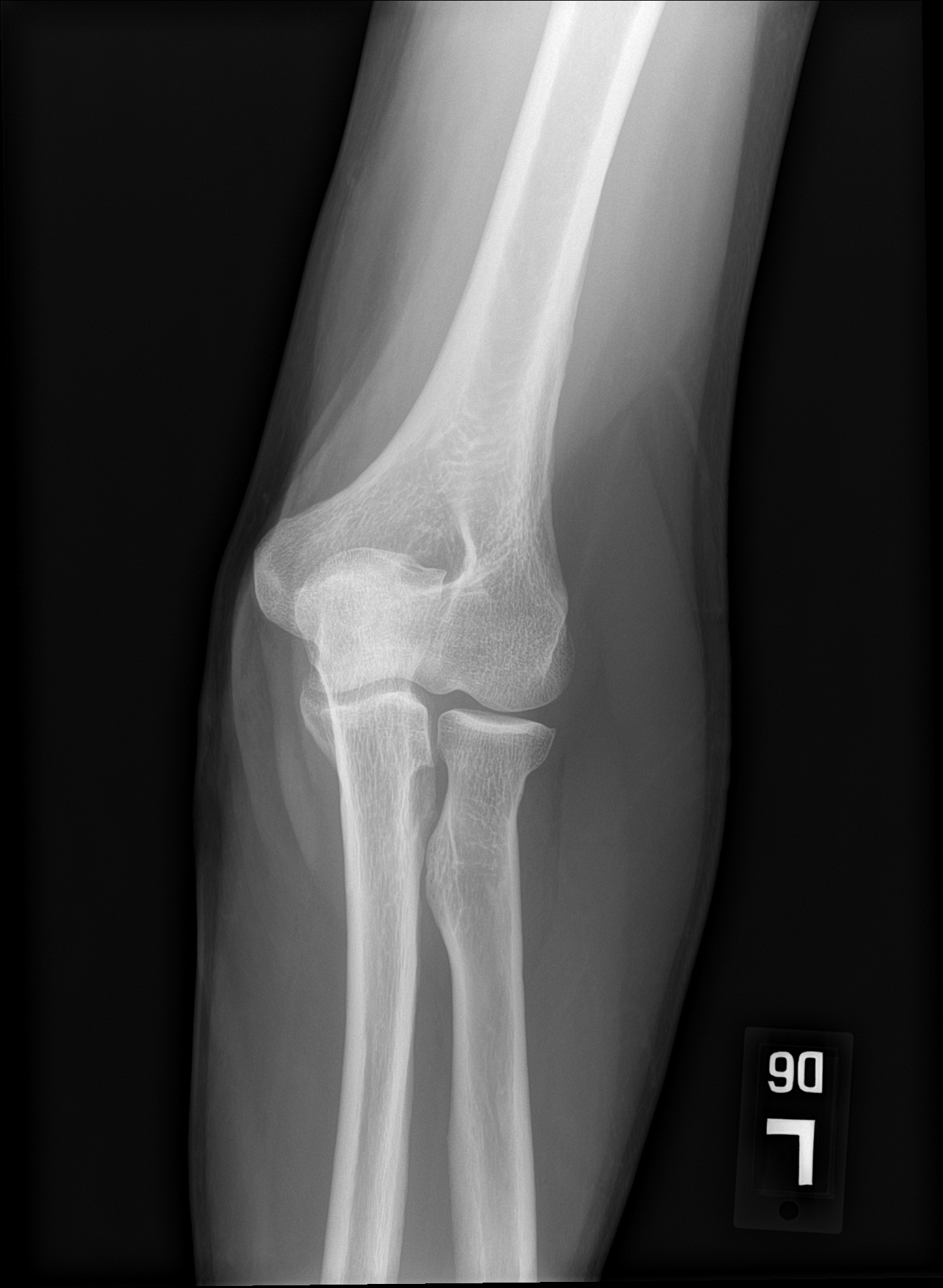

[elbow lat]
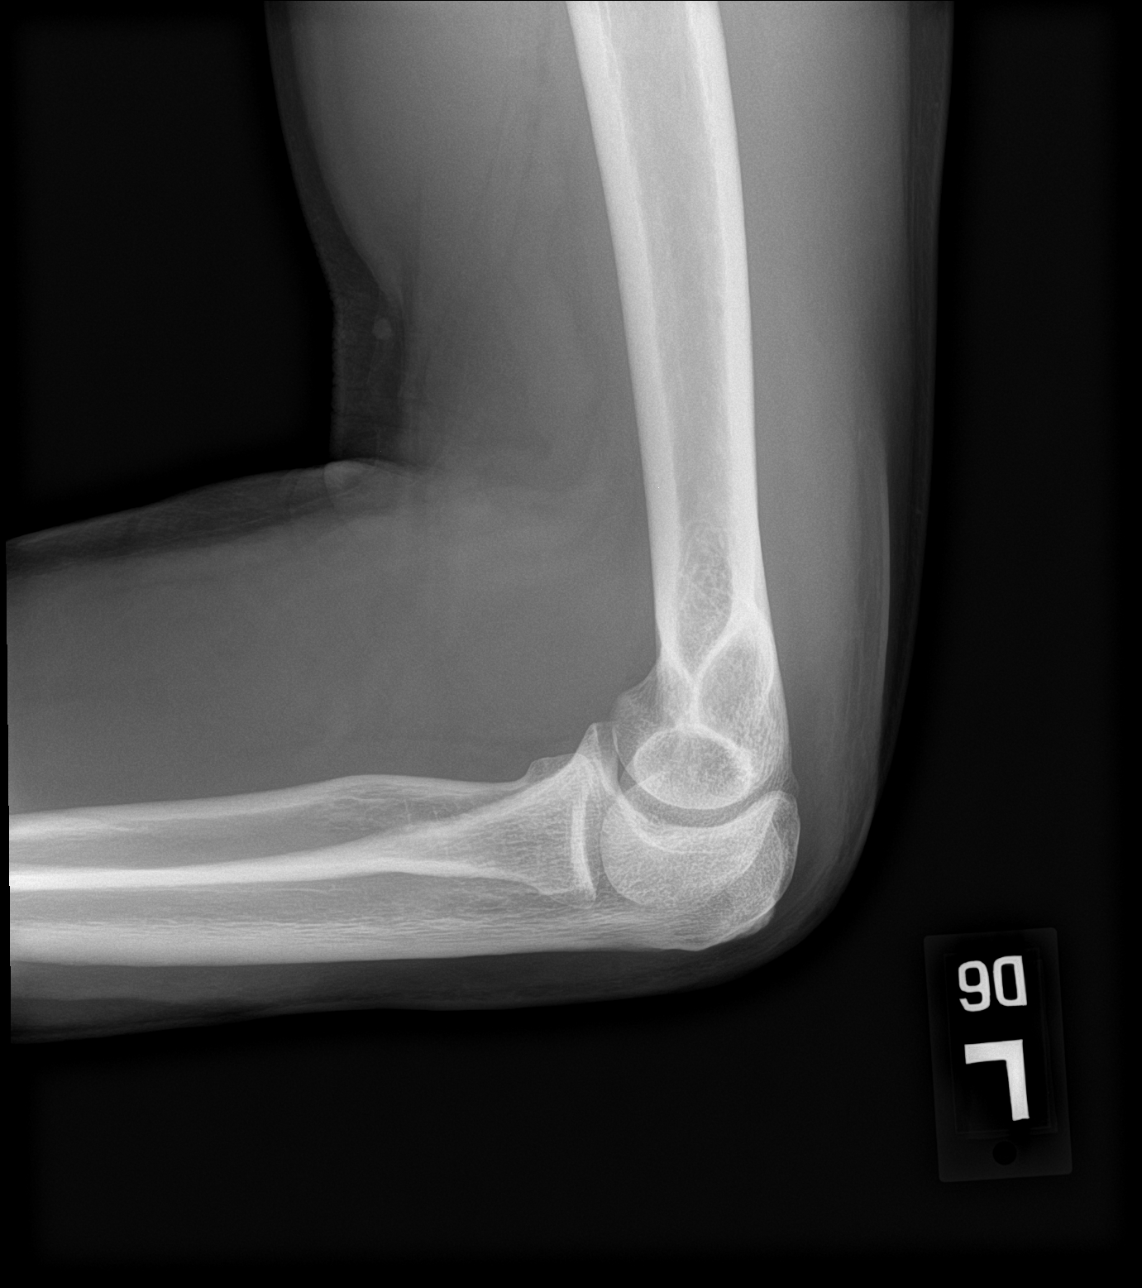

[4 of 4 positions shown; findings below may reference images not displayed]

FINDINGS: There is no evidence of fracture, dislocation, or joint effusion.
There is no evidence of arthropathy or other focal bone abnormality.
Soft tissues are unremarkable.
IMPRESSION: Negative.
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
## 2022-08-26 ENCOUNTER — Ambulatory Visit: Payer: Self-pay

## 2022-08-26 ENCOUNTER — Ambulatory Visit: Payer: Commercial Managed Care - PPO | Admitting: Family Medicine

## 2022-08-26 VITALS — BP 122/84 | HR 60 | Ht 72.0 in | Wt 192.0 lb

## 2022-08-26 DIAGNOSIS — M25561 Pain in right knee: Secondary | ICD-10-CM

## 2022-08-26 DIAGNOSIS — M7041 Prepatellar bursitis, right knee: Secondary | ICD-10-CM | POA: Diagnosis not present

## 2022-08-26 DIAGNOSIS — G8929 Other chronic pain: Secondary | ICD-10-CM | POA: Diagnosis not present

## 2022-08-26 NOTE — Patient Instructions (Addendum)
Thank you for coming in today.   Call or go to the ER if you develop a large red swollen joint with extreme pain or oozing puss.    Return in 1 month.   Use compression

## 2022-09-22 NOTE — Progress Notes (Unsigned)
   Shirlyn Goltz, PhD, LAT, ATC acting as a scribe for Gary Leader, MD.  Gary Andrews is a 67 y.o. male who presents to [place] at [facility] today for f/u R knee pain. Pt was last seen by Dr. Georgina Snell on 08/26/22 and his R prepatellar bursa was aspirated and injected w/ a steroid. He was also advised to cont compression using knee sleeve. Today, pt reports ***  Dx imaging: 02/07/22 R knee XR   Pertinent review of systems: ***  Relevant historical information: ***   Exam:  There were no vitals taken for this visit. General: Well Developed, well nourished, and in no acute distress.   MSK: ***    Lab and Radiology Results No results found for this or any previous visit (from the past 72 hour(s)). No results found.     Assessment and Plan: 67 y.o. male with ***   PDMP not reviewed this encounter. No orders of the defined types were placed in this encounter.  No orders of the defined types were placed in this encounter.    Discussed warning signs or symptoms. Please see discharge instructions. Patient expresses understanding.   ***

## 2022-09-23 ENCOUNTER — Ambulatory Visit: Payer: Self-pay

## 2022-09-23 ENCOUNTER — Ambulatory Visit: Payer: Commercial Managed Care - PPO | Admitting: Family Medicine

## 2022-09-23 ENCOUNTER — Encounter: Payer: Self-pay | Admitting: Family Medicine

## 2022-09-23 VITALS — BP 122/82 | HR 73 | Ht 72.0 in | Wt 190.0 lb

## 2022-09-23 DIAGNOSIS — M7041 Prepatellar bursitis, right knee: Secondary | ICD-10-CM

## 2022-09-23 DIAGNOSIS — G8929 Other chronic pain: Secondary | ICD-10-CM | POA: Diagnosis not present

## 2022-09-23 DIAGNOSIS — M25561 Pain in right knee: Secondary | ICD-10-CM

## 2022-09-23 NOTE — Patient Instructions (Signed)
Thank you for coming in today.   Continue compression as needed.   Please use Voltaren gel (Generic Diclofenac Gel) up to 4x daily for pain as needed.  This is available over-the-counter as both the name brand Voltaren gel and the generic diclofenac gel.   Recheck with me as needed for this or other issues.

## 2023-06-03 ENCOUNTER — Other Ambulatory Visit: Payer: Self-pay | Admitting: Cardiovascular Disease

## 2023-07-22 ENCOUNTER — Other Ambulatory Visit: Payer: Self-pay | Admitting: Cardiovascular Disease

## 2023-07-22 NOTE — Telephone Encounter (Signed)
 Pt has not been seen since 2022. Please advise thank you

## 2023-07-23 ENCOUNTER — Other Ambulatory Visit: Payer: Self-pay

## 2023-07-23 MED ORDER — LISINOPRIL 2.5 MG PO TABS: 2.5000 mg | ORAL_TABLET | Freq: Every day | ORAL | 0 refills | Status: AC

## 2023-07-23 NOTE — Telephone Encounter (Signed)
 Called pt and got scheduled for overdue appt w/NA. Refill for lisinopril sent in at this time.

## 2023-08-03 ENCOUNTER — Encounter: Payer: Self-pay | Admitting: Cardiovascular Disease
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
## 2023-08-04 ENCOUNTER — Encounter: Payer: Self-pay | Admitting: Cardiovascular Disease

## 2023-08-04 ENCOUNTER — Ambulatory Visit: Payer: Commercial Managed Care - PPO | Attending: Cardiovascular Disease | Admitting: Cardiovascular Disease

## 2023-08-04 VITALS — BP 108/78 | HR 67 | Ht 72.0 in | Wt 197.2 lb

## 2023-08-04 DIAGNOSIS — I2510 Atherosclerotic heart disease of native coronary artery without angina pectoris: Secondary | ICD-10-CM

## 2023-08-04 DIAGNOSIS — E785 Hyperlipidemia, unspecified: Secondary | ICD-10-CM | POA: Diagnosis not present

## 2023-08-04 NOTE — Patient Instructions (Signed)
Medication Instructions:  STOP Coreg/Carvedilol  *If you need a refill on your cardiac medications before your next appointment, please call your pharmacy*   Lab Work: Lipids, ALT, BMET, Lipoprotein(a) today If you have labs (blood work) drawn today and your tests are completely normal, you will receive your results only by: MyChart Message (if you have MyChart) OR A paper copy in the mail If you have any lab test that is abnormal or we need to change your treatment, we will call you to review the results.  Follow-Up: At [facility], you and your health needs are our priority.  As part of our continuing mission to provide you with exceptional heart care, we have created designated Provider Care Teams.  These Care Teams include your primary Cardiologist (physician) and Advanced Practice Providers (APPs -  Physician Assistants and Nurse Practitioners) who all work together to provide you with the care you need, when you need it.  Your next appointment:   1 year(s)  Provider:   Mackey Birchwood, MD     1st Floor: - Lobby - Registration  - Pharmacy  - Lab - Cafe  2nd Floor: - PV Lab - Diagnostic Testing (echo, CT, nuclear med)  3rd Floor: - Vacant  4th Floor: - TCTS (cardiothoracic surgery) - AFib Clinic - Structural Heart Clinic - Vascular Surgery  - Vascular Ultrasound  5th Floor: - HeartCare Cardiology (general and EP) - Clinical Pharmacy for coumadin, hypertension, lipid, weight-loss medications, and med management appointments   Valet parking services will be available as well.

## 2023-08-05 ENCOUNTER — Encounter: Payer: Self-pay | Admitting: Cardiovascular Disease

## 2023-08-05 LAB — BASIC METABOLIC PANEL
BUN/Creatinine Ratio: 23 (ref 10–24)
BUN: 19 mg/dL (ref 8–27)
CO2: 24 mmol/L (ref 20–29)
Calcium: 9.7 mg/dL (ref 8.6–10.2)
Chloride: 107 mmol/L — ABNORMAL HIGH (ref 96–106)
Creatinine, Ser: 0.83 mg/dL (ref 0.76–1.27)
Glucose: 93 mg/dL (ref 70–99)
Potassium: 5.1 mmol/L (ref 3.5–5.2)
Sodium: 146 mmol/L — ABNORMAL HIGH (ref 134–144)
eGFR: 97 mL/min/{1.73_m2} (ref >59)

## 2023-08-05 LAB — LIPID PANEL
Chol/HDL Ratio: 2.7 {ratio} (ref 0.0–5.0)
Cholesterol, Total: 151 mg/dL (ref 100–199)
HDL: 56 mg/dL (ref >39)
LDL Chol Calc (NIH): 82 mg/dL (ref 0–99)
Triglycerides: 66 mg/dL (ref 0–149)
VLDL Cholesterol Cal: 13 mg/dL (ref 5–40)

## 2023-08-05 LAB — ALT: ALT: 48 [IU]/L — ABNORMAL HIGH (ref 0–44)

## 2023-08-05 LAB — LIPOPROTEIN A (LPA): Lipoprotein (a): <8.4 nmol/L (ref <75.0)

## 2023-08-09 ENCOUNTER — Telehealth: Payer: Self-pay

## 2023-08-09 DIAGNOSIS — E782 Mixed hyperlipidemia: Secondary | ICD-10-CM

## 2023-08-09 NOTE — Telephone Encounter (Signed)
-----   Message from Kristeen Miss sent at 08/05/2023  3:45 PM EST ----- Sodium and chloride are mildly elevated.   I suspect he was mildly dehydrated. ALT is slightly higher, not signifcantly higher to change dose of his rosuvastatin LP(a) is < 8.4  this is a great result  LDL is 82  His goal is < 70   He is on Rosuvastatin and zetia  but he is not at goal.  He is at ideal body weight, is an avid cyclist.   Please refer to the lipid clinic for consideration for PCSK9 inhibitor or other medication to help him reach his LDL goal of < 70

## 2023-08-09 NOTE — Telephone Encounter (Signed)
Patient has viewed MD's comments and recommendations via MyChart. Referral to lipid clinic placed at this time.

## 2023-09-02 ENCOUNTER — Encounter: Payer: Self-pay | Admitting: Cardiovascular Disease

## 2023-09-10 ENCOUNTER — Other Ambulatory Visit (HOSPITAL_COMMUNITY): Payer: Self-pay

## 2023-09-10 ENCOUNTER — Telehealth: Payer: Self-pay | Admitting: Pharmacy Technician

## 2023-09-10 ENCOUNTER — Ambulatory Visit: Payer: Commercial Managed Care - PPO | Attending: Cardiovascular Disease | Admitting: Pharmacist

## 2023-09-10 DIAGNOSIS — E785 Hyperlipidemia, unspecified: Secondary | ICD-10-CM

## 2023-09-10 DIAGNOSIS — I2510 Atherosclerotic heart disease of native coronary artery without angina pectoris: Secondary | ICD-10-CM

## 2023-09-10 NOTE — Telephone Encounter (Signed)
-----   Message from Olene Floss sent at 09/10/2023  2:13 PM EST ----- Please do PA for Repatha ASCVD on rosuvastatin and ezetimibe

## 2023-09-10 NOTE — Assessment & Plan Note (Addendum)
 Assessment: LDL-C above goal of less than 55 due to progressive ASCVD and premature CAD Currently on rosuvastatin 20 mg and ezetimibe 10 mg daily LDL-C 82 He is very active rides his bike about 3 times a week 30 to 50 miles per ride No strength training which I did recommend he try to incorporate Does drink alcohol but only on the weekends Does not follow a particular diet, eats pretty much what ever he wants but does eat in moderation We reviewed diet low in saturated fat high in fiber  Plan: Will submit prior authorization for Repatha Continue rosuvastatin 20 mg daily Can stop ezetimibe when he starts Repatha Labs in 3 months Patient request 88-month supply and for [place] to send him a MyChart message
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
## 2023-09-10 NOTE — Telephone Encounter (Signed)
 Pharmacy Patient Advocate Encounter   Received notification from Physician's Office that prior authorization for Repatha is required/requested.   Insurance verification completed.   The patient is insured through  [employer]  .   Per test claim: PA required; PA submitted to above mentioned insurance via CoverMyMeds Key/confirmation #/EOC [id number] Status is pending

## 2023-09-10 NOTE — Patient Instructions (Signed)

## 2023-09-16 ENCOUNTER — Other Ambulatory Visit (HOSPITAL_COMMUNITY): Payer: Self-pay

## 2023-09-16 ENCOUNTER — Encounter: Payer: Self-pay | Admitting: Pharmacist

## 2023-09-16 DIAGNOSIS — E785 Hyperlipidemia, unspecified: Secondary | ICD-10-CM

## 2023-09-16 NOTE — Telephone Encounter (Signed)
 Can you follow up on this? I checked CMM and there is no determination in there. Its been about a week

## 2023-09-23 ENCOUNTER — Other Ambulatory Visit (HOSPITAL_COMMUNITY): Payer: Self-pay

## 2023-09-28 MED ORDER — REPATHA SURECLICK 140 MG/ML ~~LOC~~ SOAJ: 1.0000 mL | SUBCUTANEOUS | 11 refills | Status: AC

## 2023-10-01 NOTE — Telephone Encounter (Signed)
 Shawn from [facility] [employer] is requesting a callback at [phone] to check the status of the PA on this repatha. Please advise

## 2023-10-01 NOTE — Telephone Encounter (Signed)
 I called and spoke to shawn and let him know that the patient has insurance and this is covered. They said he will call the patient to let him know he can get the coupon like Efraim Kaufmann said and get this at [employer].

## 2024-01-08 ENCOUNTER — Other Ambulatory Visit: Payer: Self-pay | Admitting: Internal Medicine

## 2024-02-13 ENCOUNTER — Ambulatory Visit
Admission: EM | Admit: 2024-02-13 | Discharge: 2024-02-13 | Disposition: A | Discharge status: Home / Self Care | Attending: Emergency Medicine | Admitting: Emergency Medicine

## 2024-02-13 ENCOUNTER — Encounter: Payer: Self-pay | Admitting: Emergency Medicine

## 2024-02-13 DIAGNOSIS — I440 Atrioventricular block, first degree: Secondary | ICD-10-CM | POA: Diagnosis not present

## 2024-02-13 DIAGNOSIS — Z8674 Personal history of sudden cardiac arrest: Secondary | ICD-10-CM | POA: Diagnosis not present

## 2024-02-13 DIAGNOSIS — E785 Hyperlipidemia, unspecified: Secondary | ICD-10-CM | POA: Diagnosis not present

## 2024-02-13 DIAGNOSIS — T733XXA Exhaustion due to excessive exertion, initial encounter: Secondary | ICD-10-CM | POA: Insufficient documentation

## 2024-02-13 DIAGNOSIS — X509XXA Other and unspecified overexertion or strenuous movements or postures, initial encounter: Secondary | ICD-10-CM

## 2024-02-13 DIAGNOSIS — Z955 Presence of coronary angioplasty implant and graft: Secondary | ICD-10-CM | POA: Insufficient documentation

## 2024-02-13 DIAGNOSIS — I252 Old myocardial infarction: Secondary | ICD-10-CM | POA: Insufficient documentation

## 2024-02-13 DIAGNOSIS — I10 Essential (primary) hypertension: Secondary | ICD-10-CM | POA: Insufficient documentation

## 2024-02-13 DIAGNOSIS — R112 Nausea with vomiting, unspecified: Secondary | ICD-10-CM | POA: Insufficient documentation

## 2024-02-13 DIAGNOSIS — I4891 Unspecified atrial fibrillation: Secondary | ICD-10-CM | POA: Diagnosis not present

## 2024-02-13 DIAGNOSIS — X58XXXA Exposure to other specified factors, initial encounter: Secondary | ICD-10-CM | POA: Insufficient documentation

## 2024-02-13 LAB — COMPREHENSIVE METABOLIC PANEL WITH GFR
ALT: 22 U/L (ref 0–44)
AST: 35 U/L (ref 15–41)
Albumin: 5.4 g/dL — ABNORMAL HIGH (ref 3.5–5.0)
Alkaline Phosphatase: 64 U/L (ref 38–126)
Anion gap: 14 (ref 5–15)
BUN: 25 mg/dL — ABNORMAL HIGH (ref 8–23)
CO2: 24 mmol/L (ref 22–32)
Calcium: 10.7 mg/dL — ABNORMAL HIGH (ref 8.9–10.3)
Chloride: 107 mmol/L (ref 98–111)
Creatinine, Ser: 1.18 mg/dL (ref 0.61–1.24)
GFR, Estimated: >60 mL/min (ref >60)
Glucose, Bld: 109 mg/dL — ABNORMAL HIGH (ref 70–99)
Potassium: 4.6 mmol/L (ref 3.5–5.1)
Sodium: 145 mmol/L (ref 135–145)
Total Bilirubin: 1.5 mg/dL — ABNORMAL HIGH (ref 0.0–1.2)
Total Protein: 8.3 g/dL — ABNORMAL HIGH (ref 6.5–8.1)

## 2024-02-13 MED ORDER — ONDANSETRON 8 MG PO TBDP: ORAL_TABLET | ORAL | 0 refills | Status: DC

## 2024-02-13 MED ORDER — ONDANSETRON 8 MG PO TBDP
8.0000 mg | ORAL_TABLET | Freq: Once | ORAL | Status: AC
  Administered 2024-02-13: 8 mg via ORAL

## 2024-02-13 NOTE — Discharge Instructions (Addendum)
 Your EKG did not show any signs of a STEMI although you had a single flipped T wave in lead III.  Your sodium, potassium, and creatinine are all within normal limits, which is reassuring.  I suspect that you overexerted yourself, however, given your history, this could be an anginal equivalent.  I would check in with your cardiologist ASAP and have your EKG repeated.  Continue pushing electrolyte containing fluids.  Zofran  3 times a day as needed for nausea and vomiting.  If your symptoms return, if you start having chest pain, pressure, heaviness, shortness of breath, palpitations, you must go to the emergency department immediately for comprehensive evaluation.  You can also go to the ED for comprehensive evaluation if you have any concerns.
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
## 2024-02-13 NOTE — ED Triage Notes (Signed)
 Patient states he went for a bike ride about 2 hours ago and started having some N/V.  Patient denies any pain.

## 2024-07-20 ENCOUNTER — Encounter: Payer: Self-pay | Admitting: Cardiovascular Disease

## 2024-08-13 NOTE — Progress Notes (Unsigned)
 " Cardiology Office Note:  .   Date:  08/16/2024  ID:  Gary Andrews, DOB [birth year], MRN [id number] PCP: Joshua Debby CROME, MD  [facility] Providers Cardiologist:  Darryle ONEIDA Decent, MD   History of Present Illness: .    Chief Complaint  Patient presents with   Follow-up    Gary Andrews is a 69 y.o. male with below history who presents for follow-up.   History of Present Illness   Gary Andrews is a 69 year old male with coronary artery disease and ischemic cardiomyopathy who presents for follow-up.  He has a history of coronary artery disease with multiple percutaneous coronary interventions, including a ST-elevation myocardial infarction and ventricular fibrillation in 2013, which required intervention of the left anterior descending artery.  No chest pain, trouble breathing, or leg swelling has been reported since the last episode, and there have been no further episodes of atrial fibrillation since it was noted during his heart attack.  His current medication regimen includes Repatha  injections, a baby aspirin , and lisinopril , although he recently ran out of lisinopril . He has been on various medications in the past and is unsure about the current necessity of some of them. His most recent LDL cholesterol level was 82 mg/dL in January of the previous year.  He has a family history of heart disease, with his father having undergone bypass surgery and a brother with ten stents. He does not smoke and consumes alcohol in moderation. He remains active, working in the [employer] and regularly riding bikes.           Problem List CAD -PCI LCX 2010/2011 -STEMI/VF arrest 05/2012 -> PCI LAD 2. Ischemic CM with recovery of EF  -EF 50-55% 07/19/2012 3. Paroxysmal Afib  4. HLD -T chol 151, HDL 56, LDL 82, TG 66 -Lpa <8.4    ROS: All other ROS reviewed and negative. Pertinent positives noted in the HPI.     Studies Reviewed: SABRA   EKG Interpretation Date/Time:  Wednesday August 16 2024 08:31:59 EST Ventricular Rate:  52 PR Interval:  238 QRS Duration:  92 QT Interval:  440 QTC Calculation: 409 R Axis:   50  Text Interpretation: Sinus bradycardia with 1st degree A-V block Confirmed by Decent Darryle [phone]) on 08/16/2024 8:33:38 AM    TTE 07/19/2012 - Left ventricle: The cavity size was normal. There was mild    concentric hypertrophy. Systolic function was normal. The    estimated ejection fraction was in the range of 50% to    55%. Wall motion was normal; there were no regional wall    motion abnormalities. Left ventricular diastolic function    parameters were normal.  - Mitral valve: Mild regurgitation.  - Right ventricle: Systolic function was normal.  - Pulmonary arteries: Systolic pressure was within the    normal range.   Cardiac catheterization 05/14/12 LM distal 20 LAD proximal 99, mid 50-60 LCx proximal 50, stent patent, AV groove circumflex jailed by stent (50) RCA proximal 30-40 Anterolateral hypokinesis, EF 35 PCI: 2.75 x 24 mm Promus element DES proximal LAD Physical Exam:   VS:  BP 120/70 (BP Location: Left Arm, Patient Position: Sitting, Cuff Size: Normal)   Pulse (!) 52   Ht 6' (1.829 m)   Wt 195 lb (88.5 kg)   BMI 26.45 kg/m    Wt Readings from Last 3 Encounters:  08/16/24 195 lb (88.5 kg)  02/13/24 197 lb 1.5 oz (89.4 kg)  08/04/23 197 lb 3.2 oz (  89.4 kg)    GEN: Well nourished, well developed in no acute distress NECK: No JVD; No carotid bruits CARDIAC: RRR, no murmurs, rubs, gallops RESPIRATORY:  Clear to auscultation without rales, wheezing or rhonchi  ABDOMEN: Soft, non-tender, non-distended EXTREMITIES:  No edema; No deformity  ASSESSMENT AND PLAN: .   Assessment and Plan    Coronary artery disease status post multiple PCIs and anterior STEMI Coronary artery disease with multiple PCIs and anterior STEMI in 2013. EKG shows sinus rhythm with first-degree AV block. No angina reported. - Continue aspirin  therapy  indefinitely. - Ordered lipid panel, CMP, CBC, and TSH. - Scheduled echocardiogram to assess heart function.  Ischemic cardiomyopathy with recovered ejection fraction (EF 55%) Ischemic cardiomyopathy with recovered EF of 55% as of 2014. Lisinopril  discontinued due to normal blood pressure. - Held lisinopril . - Scheduled echocardiogram to reassess EF.  Mixed hyperlipidemia Managed with Repatha . Previous LDL was 82 mg/dL in January 7975. Goal is to reduce LDL to less than 55 mg/dL. - Ordered lipid panel to reassess LDL levels. - Continue Repatha  therapy. - Will consider adding rosuvastatin  if LDL remains elevated.  Paroxysmal atrial fibrillation (remote, no current symptoms) Paroxysmal atrial fibrillation noted during previous cardiac events. No current symptoms or episodes reported.                Follow-up: Return in about 1 year (around 08/16/2025).  Signed, Darryle DASEN. Barbaraann, MD, [facility]  [facility]  [address] [place], [place] [address] [phone]  8:46 AM   "

## 2024-08-16 ENCOUNTER — Ambulatory Visit: Admitting: Cardiovascular Disease

## 2024-08-16 ENCOUNTER — Encounter: Payer: Self-pay | Admitting: Cardiovascular Disease

## 2024-08-16 VITALS — BP 120/70 | HR 52 | Ht 72.0 in | Wt 195.0 lb

## 2024-08-16 DIAGNOSIS — I2510 Atherosclerotic heart disease of native coronary artery without angina pectoris: Secondary | ICD-10-CM | POA: Diagnosis not present

## 2024-08-16 DIAGNOSIS — E782 Mixed hyperlipidemia: Secondary | ICD-10-CM | POA: Diagnosis not present

## 2024-08-16 NOTE — Patient Instructions (Signed)
 Medication Instructions:  Your physician recommends that you continue on your current medications as directed. Please refer to the Current Medication list given to you today.  *If you need a refill on your cardiac medications before your next appointment, please call your pharmacy*  Lab Work: CBC, CMP, Lipid Profile, TSH Today If you have labs (blood work) drawn today and your tests are completely normal, you will receive your results only by: MyChart Message (if you have MyChart) OR A paper copy in the mail If you have any lab test that is abnormal or we need to change your treatment, we will call you to review the results.  Testing/Procedures: Echocardiogram  Follow-Up: At [facility], you and your health needs are our priority.  As part of our continuing mission to provide you with exceptional heart care, our providers are all part of one team.  This team includes your primary Cardiologist (physician) and Advanced Practice Providers or APPs (Physician Assistants and Nurse Practitioners) who all work together to provide you with the care you need, when you need it.  Your next appointment:   1 year(s)  Provider:   Darryle ONEIDA Decent, MD    We recommend signing up for the patient portal called MyChart.  Sign up information is provided on this After Visit Summary.  MyChart is used to connect with patients for Virtual Visits (Telemedicine).  Patients are able to view lab/test results, encounter notes, upcoming appointments, etc.  Non-urgent messages can be sent to your provider as well.   To learn more about what you can do with MyChart, go to [email or web address].   Other Instructions  ECHOCARDIOGRAM  Your physician has requested that you have an echocardiogram. Echocardiography is a painless test that uses sound waves to create images of your heart. It provides your doctor with information about the size and shape of your heart and how well your hearts chambers and valves  are working. This procedure takes approximately one hour. There are no restrictions for this procedure. Please do NOT wear cologne, perfume, aftershave, or lotions (deodorant is allowed). Please arrive 15 minutes prior to your appointment time.  Please note: We ask at that you not bring children with you during ultrasound (echo/ vascular) testing. Due to room size and safety concerns, children are not allowed in the ultrasound rooms during exams. Our front office staff cannot provide observation of children in our lobby area while testing is being conducted. An adult accompanying a patient to their appointment will only be allowed in the ultrasound room at the discretion of the ultrasound technician under special circumstances. We apologize for any inconvenience.

## 2024-08-17 ENCOUNTER — Ambulatory Visit: Payer: Self-pay | Admitting: Cardiovascular Disease

## 2024-08-17 DIAGNOSIS — E782 Mixed hyperlipidemia: Secondary | ICD-10-CM

## 2024-08-17 LAB — CBC
Hematocrit: 44.6 % (ref 37.5–51.0)
Hemoglobin: 15.3 g/dL (ref 13.0–17.7)
MCH: 32.3 pg (ref 26.6–33.0)
MCHC: 34.3 g/dL (ref 31.5–35.7)
MCV: 94 fL (ref 79–97)
Platelets: 202 10*3/uL (ref 150–450)
RBC: 4.73 x10E6/uL (ref 4.14–5.80)
RDW: 13.1 % (ref 11.6–15.4)
WBC: 3.8 10*3/uL (ref 3.4–10.8)

## 2024-08-17 LAB — COMPREHENSIVE METABOLIC PANEL WITH GFR
ALT: 18 [IU]/L (ref 0–44)
AST: 27 [IU]/L (ref 0–40)
Albumin: 4.5 g/dL (ref 3.9–4.9)
Alkaline Phosphatase: 66 [IU]/L (ref 47–123)
BUN/Creatinine Ratio: 23 (ref 10–24)
BUN: 19 mg/dL (ref 8–27)
Bilirubin Total: 0.5 mg/dL (ref 0.0–1.2)
CO2: 24 mmol/L (ref 20–29)
Calcium: 9.3 mg/dL (ref 8.6–10.2)
Chloride: 105 mmol/L (ref 96–106)
Creatinine, Ser: 0.84 mg/dL (ref 0.76–1.27)
Globulin, Total: 2 g/dL (ref 1.5–4.5)
Glucose: 92 mg/dL (ref 70–99)
Potassium: 4.8 mmol/L (ref 3.5–5.2)
Sodium: 143 mmol/L (ref 134–144)
Total Protein: 6.5 g/dL (ref 6.0–8.5)
eGFR: 96 mL/min/{1.73_m2}

## 2024-08-17 LAB — LIPID PANEL
Chol/HDL Ratio: 3 ratio (ref 0.0–5.0)
Cholesterol, Total: 177 mg/dL (ref 100–199)
HDL: 60 mg/dL
LDL Chol Calc (NIH): 101 mg/dL — ABNORMAL HIGH (ref 0–99)
Triglycerides: 88 mg/dL (ref 0–149)
VLDL Cholesterol Cal: 16 mg/dL (ref 5–40)

## 2024-08-17 LAB — TSH: TSH: 2.38 u[IU]/mL (ref 0.450–4.500)

## 2024-08-17 MED ORDER — ROSUVASTATIN CALCIUM 20 MG PO TABS
20.0000 mg | ORAL_TABLET | Freq: Every day | ORAL | 3 refills | Status: AC
  Filled 2024-08-17: qty 90, 90d supply, fill #0

## 2024-08-18 ENCOUNTER — Other Ambulatory Visit (HOSPITAL_COMMUNITY): Payer: Self-pay

## 2024-10-04 ENCOUNTER — Ambulatory Visit (HOSPITAL_COMMUNITY)
